# Patient Record
Sex: Female | Born: 1970 | Race: Black or African American | Hispanic: No | Marital: Married | Smoking: Never smoker
Health system: Southern US, Community
[De-identification: ages and names within clinical notes are randomized; demographics above are authoritative.]

## PROBLEM LIST (undated history)

## (undated) DIAGNOSIS — E559 Vitamin D deficiency, unspecified: Secondary | ICD-10-CM

## (undated) DIAGNOSIS — E079 Disorder of thyroid, unspecified: Secondary | ICD-10-CM

## (undated) DIAGNOSIS — E039 Hypothyroidism, unspecified: Secondary | ICD-10-CM

## (undated) DIAGNOSIS — I10 Essential (primary) hypertension: Secondary | ICD-10-CM

## (undated) HISTORY — PX: WISDOM TOOTH EXTRACTION: SHX21

## (undated) HISTORY — PX: UMBILICAL HERNIA REPAIR: SHX196

---

## 2013-08-18 ENCOUNTER — Encounter (HOSPITAL_BASED_OUTPATIENT_CLINIC_OR_DEPARTMENT_OTHER): Payer: Self-pay | Admitting: Emergency Medicine

## 2013-08-18 ENCOUNTER — Emergency Department (HOSPITAL_BASED_OUTPATIENT_CLINIC_OR_DEPARTMENT_OTHER)
Admission: EM | Admit: 2013-08-18 | Discharge: 2013-08-18 | Disposition: A | Discharge status: Home / Self Care | Payer: Managed Care, Other (non HMO) | Attending: Emergency Medicine | Admitting: Emergency Medicine

## 2013-08-18 DIAGNOSIS — N390 Urinary tract infection, site not specified: Secondary | ICD-10-CM | POA: Insufficient documentation

## 2013-08-18 DIAGNOSIS — N898 Other specified noninflammatory disorders of vagina: Secondary | ICD-10-CM | POA: Insufficient documentation

## 2013-08-18 DIAGNOSIS — Z3202 Encounter for pregnancy test, result negative: Secondary | ICD-10-CM | POA: Insufficient documentation

## 2013-08-18 DIAGNOSIS — Z79899 Other long term (current) drug therapy: Secondary | ICD-10-CM | POA: Insufficient documentation

## 2013-08-18 DIAGNOSIS — E079 Disorder of thyroid, unspecified: Secondary | ICD-10-CM | POA: Insufficient documentation

## 2013-08-18 DIAGNOSIS — I10 Essential (primary) hypertension: Secondary | ICD-10-CM | POA: Insufficient documentation

## 2013-08-18 HISTORY — DX: Disorder of thyroid, unspecified: E07.9

## 2013-08-18 HISTORY — DX: Essential (primary) hypertension: I10

## 2013-08-18 LAB — URINALYSIS, ROUTINE W REFLEX MICROSCOPIC
BILIRUBIN URINE: NEGATIVE
Glucose, UA: NEGATIVE mg/dL
KETONES UR: NEGATIVE mg/dL
NITRITE: NEGATIVE
PROTEIN: NEGATIVE mg/dL
SPECIFIC GRAVITY, URINE: 1.018 (ref 1.005–1.030)
UROBILINOGEN UA: 1 mg/dL (ref 0.0–1.0)
pH: 6 (ref 5.0–8.0)

## 2013-08-18 LAB — URINE MICROSCOPIC-ADD ON

## 2013-08-18 LAB — PREGNANCY, URINE: Preg Test, Ur: NEGATIVE

## 2013-08-18 MED ORDER — NITROFURANTOIN MONOHYD MACRO 100 MG PO CAPS: 100.0000 mg | ORAL_CAPSULE | Freq: Two times a day (BID) | ORAL | Status: DC

## 2013-08-18 NOTE — ED Notes (Signed)
Vaginal discharge and itching x 3 days.

## 2013-08-18 NOTE — ED Provider Notes (Signed)
CSN: [id number]     Arrival date & time 08/18/13  2235 History  This chart was scribed for Lorenso Quirino Alfonso Patten, MD by Eston Mould, ED Scribe. This patient was seen in room MH08/MH08 and the patient's care was started at 11:32 PM.   Chief Complaint  Patient presents with  . Vaginal Itching   Patient is a 43 y.o. female presenting with vaginal itching. The history is provided by the patient. No language interpreter was used.  Vaginal Itching This is a new problem. The current episode started more than 2 days ago. The problem occurs constantly. The problem has not changed since onset.Nothing aggravates the symptoms. Nothing relieves the symptoms. She has tried nothing for the symptoms.   HPI Comments: Melissa Cummings is a 43 y.o. female who presents to the Emergency Department complaining of ongoing constant vaginal discharge and itching for the past 3 days. Pt states she suspects she is having a vaginal irritation due to a new shower gel. Pt states she has been using OTC vagicil. Pt denies frequency and dysuria. No discharge  Past Medical History  Diagnosis Date  . Thyroid disease   . Hypertension    History reviewed. No pertinent past surgical history. No family history on file. History  Substance Use Topics  . Smoking status: Never Smoker   . Smokeless tobacco: Not on file  . Alcohol Use: No   OB History   Grav Para Term Preterm Abortions TAB SAB Ect Mult Living                 Review of Systems  Genitourinary: Negative for dysuria and frequency.  All other systems reviewed and are negative.    Allergies  Review of patient's allergies indicates no known allergies.  Home Medications   Current Outpatient Rx  Name  Route  Sig  Dispense  Refill  . HYDROCHLOROTHIAZIDE PO   Oral   Take by mouth.         . Levothyroxine Sodium (SYNTHROID PO)   Oral   Take by mouth.          BP 119/84  Pulse 89  Temp(Src) 98.9 F (37.2 C) (Oral)  Resp 18  SpO2  98%  Physical Exam  Constitutional: She is oriented to person, place, and time. She appears well-developed and well-nourished.  HENT:  Head: Normocephalic.  Mouth/Throat: Oropharynx is clear and moist.  No swelling of lips, tongue or uvula.  Eyes: Pupils are equal, round, and reactive to light.  Neck: Normal range of motion. Neck supple.  Cardiovascular: Normal rate, regular rhythm and normal heart sounds.  Exam reveals no gallop and no friction rub.   No murmur heard. Pulmonary/Chest: Breath sounds normal. She has no rales.  Abdominal: Soft. Bowel sounds are normal. She exhibits no distension.  Genitourinary:  No redness. No vesicles. Just dryness. On external exam chaperone present  Musculoskeletal: Normal range of motion.  Neurological: She is alert and oriented to person, place, and time.  Skin: Skin is warm and dry.  Psychiatric: She has a normal mood and affect.     ED Course  Procedures DIAGNOSTIC STUDIES: Oxygen Saturation is 98% on RA, normal by my interpretation.    COORDINATION OF CARE: 11:35 PM-Discussed treatment plan which includes advised pt to avoid using shower gel and will discharge pt with antibiotics. Pt agreed to plan.   Labs Review Labs Reviewed  URINALYSIS, ROUTINE W REFLEX MICROSCOPIC  PREGNANCY, URINE   Imaging Review No results found.  EKG Interpretation None     MDM   Final diagnoses:  None    Reaction to chemical, UTI will treat  I personally performed the services described in this documentation, which was scribed in my presence. The recorded information has been reviewed and is accurate.    Carlisle Beers, MD 08/19/13 [phone]

## 2014-02-13 ENCOUNTER — Other Ambulatory Visit: Payer: Self-pay | Admitting: Family Medicine

## 2014-02-13 DIAGNOSIS — M79661 Pain in right lower leg: Secondary | ICD-10-CM

## 2014-02-14 ENCOUNTER — Ambulatory Visit
Admission: RE | Admit: 2014-02-14 | Discharge: 2014-02-14 | Disposition: A | Discharge status: Home / Self Care | Payer: Self-pay | Source: Ambulatory Visit | Attending: Family Medicine | Admitting: Family Medicine

## 2014-02-14 ENCOUNTER — Ambulatory Visit
Admission: RE | Admit: 2014-02-14 | Discharge: 2014-02-14 | Disposition: A | Discharge status: Home / Self Care | Payer: Managed Care, Other (non HMO) | Source: Ambulatory Visit | Attending: Family Medicine | Admitting: Family Medicine

## 2014-02-14 ENCOUNTER — Other Ambulatory Visit: Payer: Self-pay | Admitting: Family Medicine

## 2014-02-14 DIAGNOSIS — M79604 Pain in right leg: Secondary | ICD-10-CM

## 2014-02-14 DIAGNOSIS — M79661 Pain in right lower leg: Secondary | ICD-10-CM

## 2014-02-19 ENCOUNTER — Other Ambulatory Visit: Payer: Self-pay

## 2014-12-27 IMAGING — US US EXTREM LOW VENOUS*R*
1 series · 14 of 24 positions shown · non-contrast
Comparison: None

CLINICAL DATA: RIGHT LEG PAIN/?DVT

EXAM:
Right LOWER EXTREMITY VENOUS DOPPLER ULTRASOUND
TECHNIQUE: Gray-scale sonography with compression, as well as color and duplex
ultrasound, were performed to evaluate the deep venous system from
the level of the common femoral vein through the popliteal and
proximal calf veins.

[Series 1: us extrem low venous*right* · 14 of 28 slices shown]
[im 1/28]
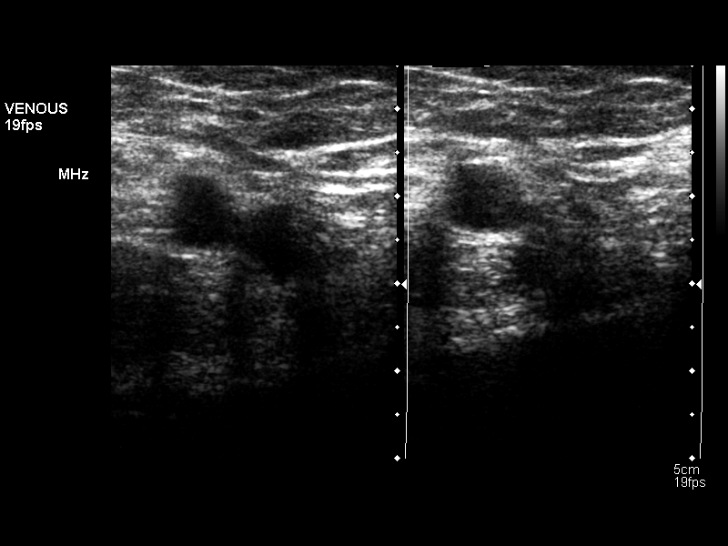
[im 3/28]
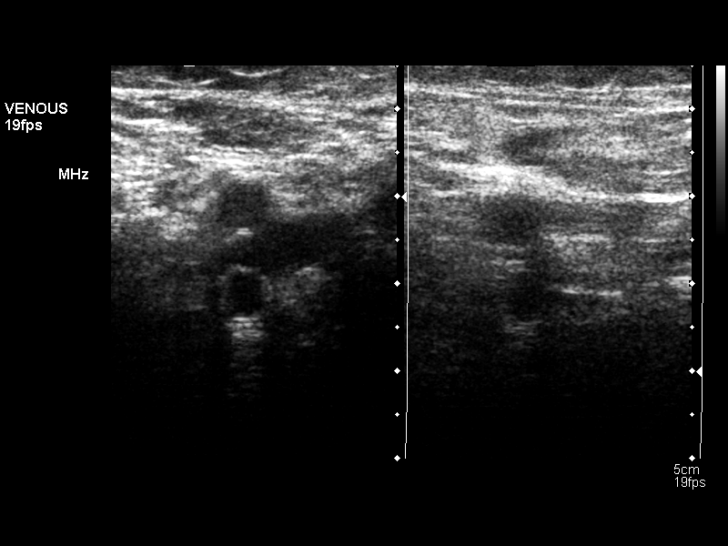
[im 5/28]
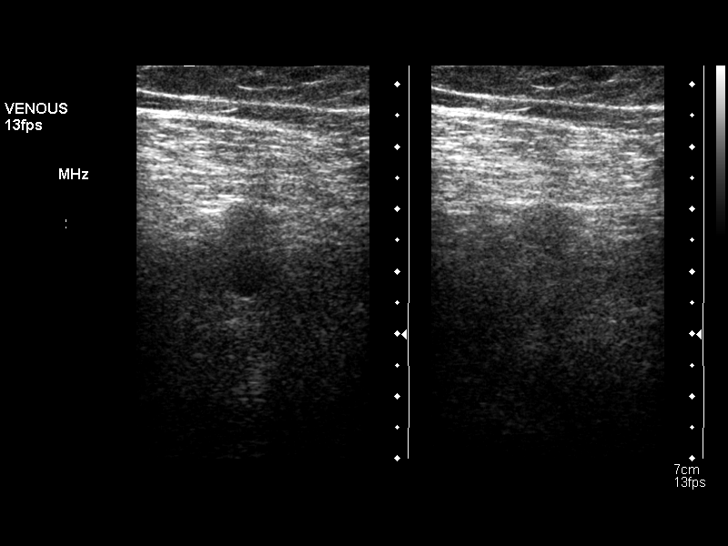
[im 8/28]
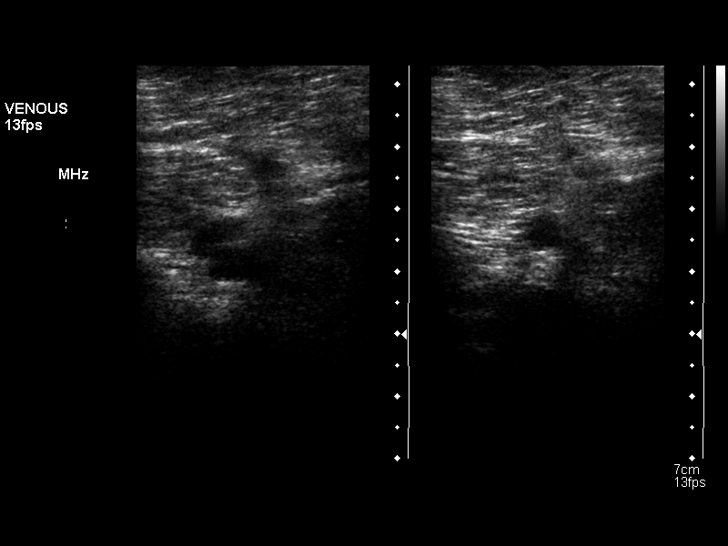
[im 9/28]
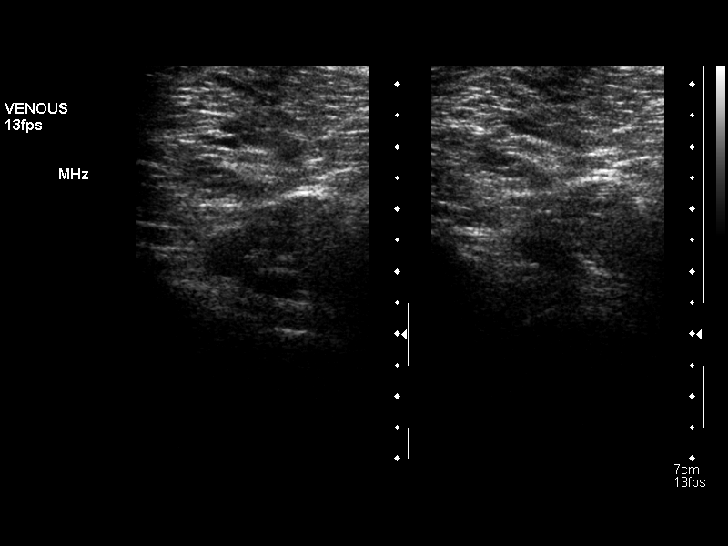
[im 11/28]
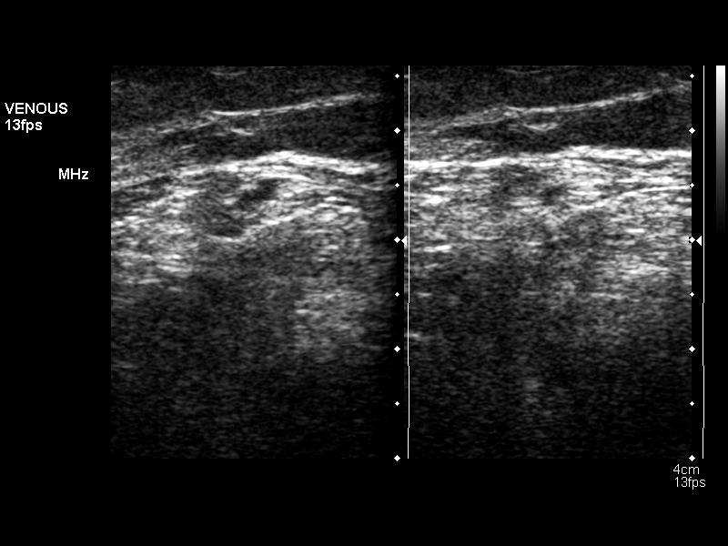
[im 13/28]
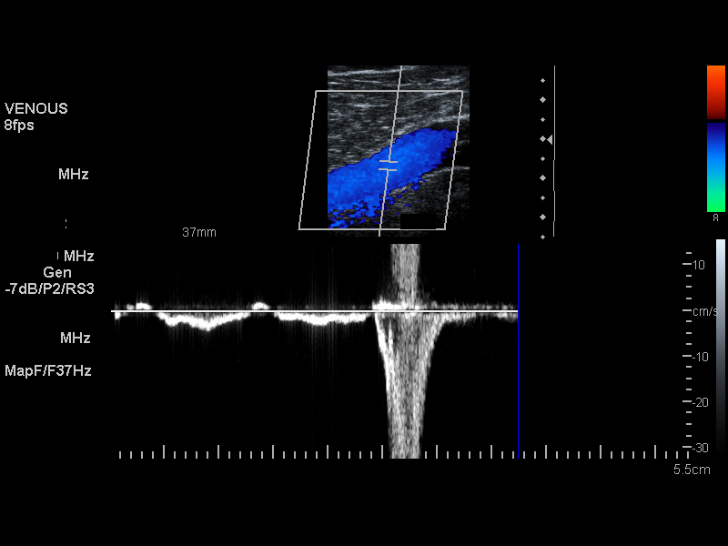
[im 15/28]
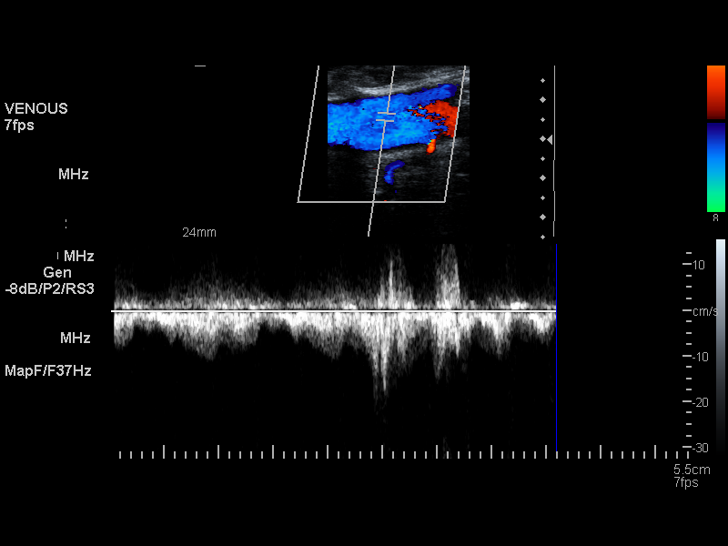
[im 17/28]
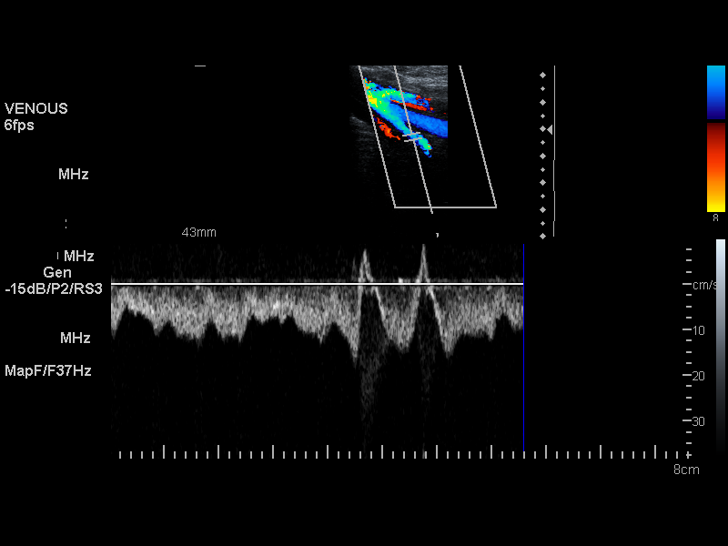
[im 19/28]
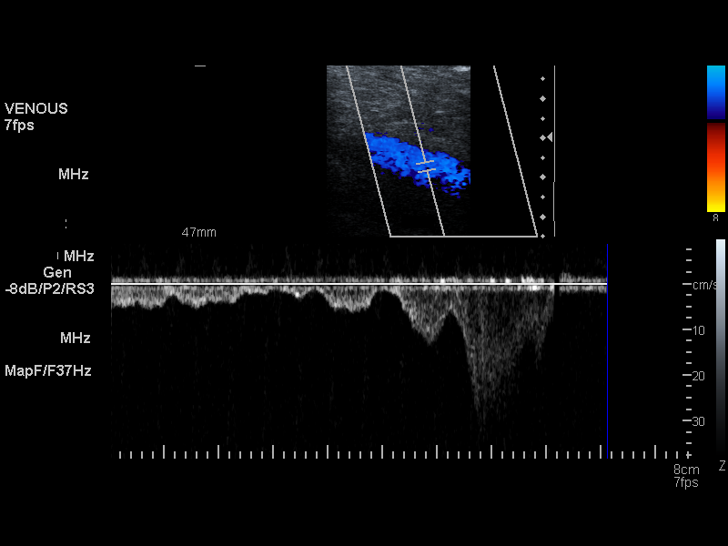
[im 22/28]
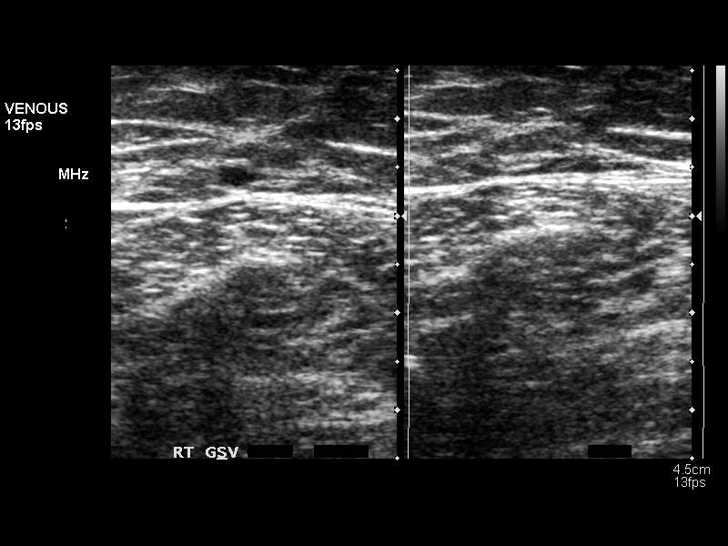
[im 23/28]
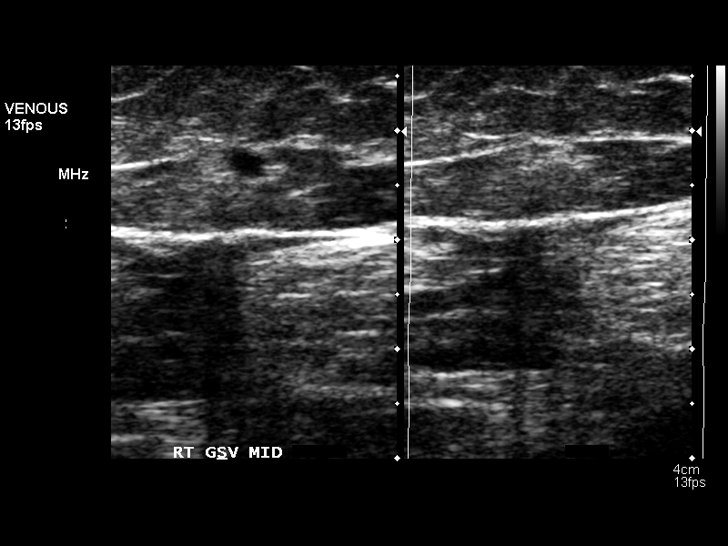
[im 25/28]
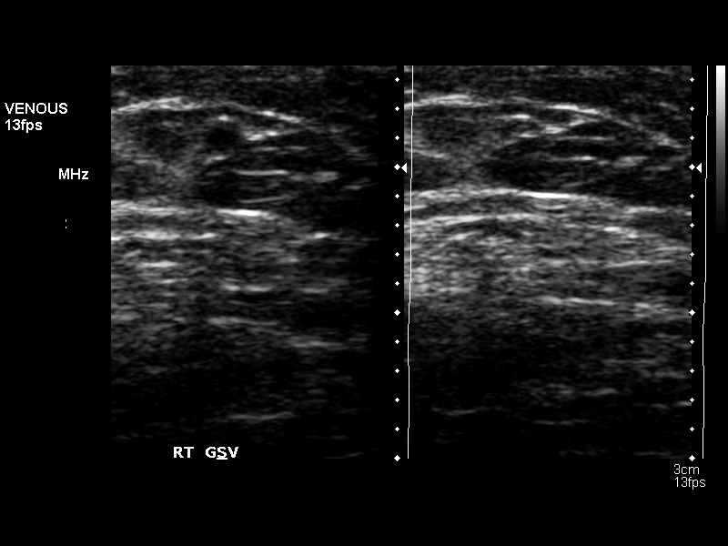
[im 28/28]
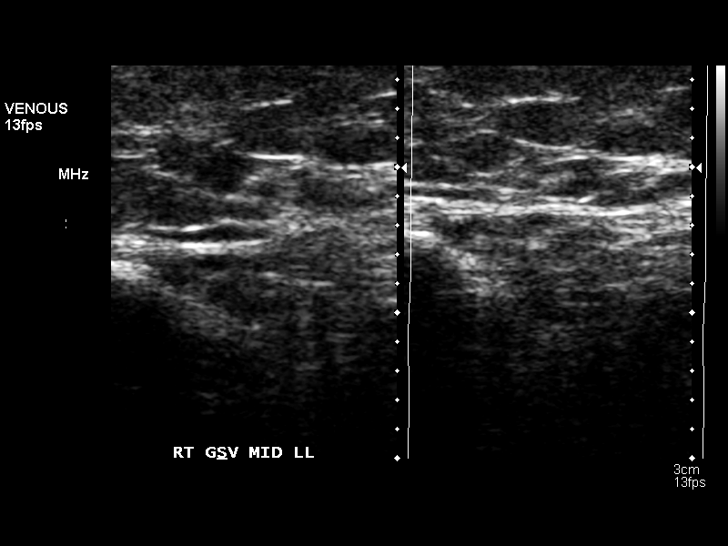

[14 of 24 positions shown; findings below may reference images not displayed]

FINDINGS: Normal compressibility of the common femoral, superficial femoral,
and popliteal veins, as well as the proximal calf veins. No filling
defects to suggest DVT on grayscale or color Doppler imaging.
Doppler waveforms show normal direction of venous flow, normal
respiratory phasicity and response to augmentation. Visualized
segments of the saphenous venous system normal in caliber and
compressibility.
IMPRESSION: 1.  No evidence of lower extremity deep vein thrombosis,right.

## 2016-07-23 ENCOUNTER — Emergency Department (HOSPITAL_COMMUNITY)
Admission: EM | Admit: 2016-07-23 | Discharge: 2016-07-23 | Discharge status: Absent without leave | Payer: Managed Care, Other (non HMO)

## 2017-01-14 ENCOUNTER — Other Ambulatory Visit: Payer: Self-pay | Admitting: Obstetrics & Gynecology

## 2017-01-15 NOTE — Patient Instructions (Addendum)
Your procedure is scheduled on:  Friday, August 3  Enter through the [employer] [facility] at:  8 am  Pick up the phone at the desk and dial [phone].  Call this number if you have problems the morning of surgery: [phone].  Remember: Do NOT eat food or drink clear liquids (including water) after Midnight Thursday  Take these medicines the morning of surgery with a SIP OF WATER:  synthroid  Stop herbal medications and supplements at this time.  Do NOT wear jewelry (body piercing), metal hair clips/bobby pins, make-up, or nail polish. Do NOT wear lotions, powders, or perfumes.  You may wear deoderant. Do NOT shave for 48 hours prior to surgery. Do NOT bring valuables to the hospital. Contacts may not be worn into surgery.  Have a responsible adult drive you home and stay with you for 24 hours after your procedure.  Home with husband Aaron Edelman cell [phone].

## 2017-01-18 ENCOUNTER — Encounter (HOSPITAL_COMMUNITY): Payer: Self-pay

## 2017-01-18 ENCOUNTER — Encounter (HOSPITAL_COMMUNITY)
Admission: RE | Admit: 2017-01-18 | Discharge: 2017-01-18 | Disposition: A | Discharge status: Home / Self Care | Payer: Managed Care, Other (non HMO) | Source: Ambulatory Visit | Attending: Obstetrics & Gynecology | Admitting: Obstetrics & Gynecology

## 2017-01-18 ENCOUNTER — Other Ambulatory Visit: Payer: Self-pay

## 2017-01-18 DIAGNOSIS — Z01810 Encounter for preprocedural cardiovascular examination: Secondary | ICD-10-CM | POA: Insufficient documentation

## 2017-01-18 DIAGNOSIS — I498 Other specified cardiac arrhythmias: Secondary | ICD-10-CM | POA: Diagnosis not present

## 2017-01-18 DIAGNOSIS — Z01812 Encounter for preprocedural laboratory examination: Secondary | ICD-10-CM | POA: Diagnosis present

## 2017-01-18 HISTORY — DX: Hypothyroidism, unspecified: E03.9

## 2017-01-18 HISTORY — DX: Vitamin D deficiency, unspecified: E55.9

## 2017-01-18 LAB — CBC
HCT: 34.7 % — ABNORMAL LOW (ref 36.0–46.0)
Hemoglobin: 11.7 g/dL — ABNORMAL LOW (ref 12.0–15.0)
MCH: 27.5 pg (ref 26.0–34.0)
MCHC: 33.7 g/dL (ref 30.0–36.0)
MCV: 81.6 fL (ref 78.0–100.0)
PLATELETS: 336 10*3/uL (ref 150–400)
RBC: 4.25 MIL/uL (ref 3.87–5.11)
RDW: 12.7 % (ref 11.5–15.5)
WBC: 7.5 10*3/uL (ref 4.0–10.5)

## 2017-01-18 LAB — BASIC METABOLIC PANEL
Anion gap: 9 (ref 5–15)
BUN: 13 mg/dL (ref 6–20)
CALCIUM: 8.8 mg/dL — AB (ref 8.9–10.3)
CO2: 25 mmol/L (ref 22–32)
CREATININE: 0.67 mg/dL (ref 0.44–1.00)
Chloride: 101 mmol/L (ref 101–111)
GFR calc Af Amer: >60 mL/min (ref >60)
GLUCOSE: 99 mg/dL (ref 65–99)
Potassium: 3.7 mmol/L (ref 3.5–5.1)
Sodium: 135 mmol/L (ref 135–145)

## 2017-01-22 ENCOUNTER — Ambulatory Visit (HOSPITAL_COMMUNITY): Payer: Managed Care, Other (non HMO) | Admitting: Anesthesiology

## 2017-01-22 ENCOUNTER — Encounter (HOSPITAL_COMMUNITY)
Admission: RE | Disposition: A | Discharge status: Home / Self Care | Payer: Self-pay | Source: Ambulatory Visit | Attending: Obstetrics & Gynecology

## 2017-01-22 ENCOUNTER — Ambulatory Visit (HOSPITAL_COMMUNITY)
Admission: RE | Admit: 2017-01-22 | Discharge: 2017-01-22 | Disposition: A | Discharge status: Home / Self Care | Payer: Managed Care, Other (non HMO) | Source: Ambulatory Visit | Attending: Obstetrics & Gynecology | Admitting: Obstetrics & Gynecology

## 2017-01-22 ENCOUNTER — Encounter (HOSPITAL_COMMUNITY): Payer: Self-pay | Admitting: Anesthesiology

## 2017-01-22 DIAGNOSIS — N921 Excessive and frequent menstruation with irregular cycle: Secondary | ICD-10-CM | POA: Insufficient documentation

## 2017-01-22 DIAGNOSIS — E039 Hypothyroidism, unspecified: Secondary | ICD-10-CM | POA: Diagnosis not present

## 2017-01-22 DIAGNOSIS — I10 Essential (primary) hypertension: Secondary | ICD-10-CM | POA: Insufficient documentation

## 2017-01-22 HISTORY — PX: DILITATION & CURRETTAGE/HYSTROSCOPY WITH HYDROTHERMAL ABLATION: SHX5570

## 2017-01-22 LAB — PREGNANCY, URINE: Preg Test, Ur: NEGATIVE

## 2017-01-22 SURGERY — DILATATION & CURETTAGE/HYSTEROSCOPY WITH HYDROTHERMAL ABLATION
Anesthesia: General | Site: Vagina

## 2017-01-22 MED ORDER — ACETAMINOPHEN 160 MG/5ML PO SOLN
ORAL | Status: AC
  Administered 2017-01-22: 975 mg via ORAL
  Filled 2017-01-22: qty 40.6

## 2017-01-22 MED ORDER — LIDOCAINE HCL (CARDIAC) 20 MG/ML IV SOLN
INTRAVENOUS | Status: AC
  Filled 2017-01-22: qty 5

## 2017-01-22 MED ORDER — CHLOROPROCAINE HCL 1 % IJ SOLN
INTRAMUSCULAR | Status: AC
  Filled 2017-01-22: qty 30

## 2017-01-22 MED ORDER — DEXAMETHASONE SODIUM PHOSPHATE 4 MG/ML IJ SOLN
INTRAMUSCULAR | Status: AC
  Filled 2017-01-22: qty 1

## 2017-01-22 MED ORDER — PROPOFOL 10 MG/ML IV BOLUS
INTRAVENOUS | Status: DC | PRN
  Administered 2017-01-22: 200 mg via INTRAVENOUS

## 2017-01-22 MED ORDER — CARBOPROST TROMETHAMINE 250 MCG/ML IM SOLN
INTRAMUSCULAR | Status: DC | PRN
  Administered 2017-01-22: 250 ug via INTRAMUSCULAR

## 2017-01-22 MED ORDER — ACETAMINOPHEN 160 MG/5ML PO SOLN
975.0000 mg | Freq: Four times a day (QID) | ORAL | Status: AC | PRN
  Administered 2017-01-22: 975 mg via ORAL

## 2017-01-22 MED ORDER — FENTANYL CITRATE (PF) 100 MCG/2ML IJ SOLN
INTRAMUSCULAR | Status: AC
  Filled 2017-01-22: qty 2

## 2017-01-22 MED ORDER — OXYCODONE-ACETAMINOPHEN 5-325 MG PO TABS: 1.0000 | ORAL_TABLET | Freq: Four times a day (QID) | ORAL | 0 refills | Status: DC | PRN

## 2017-01-22 MED ORDER — IBUPROFEN 200 MG PO TABS: 600.0000 mg | ORAL_TABLET | Freq: Four times a day (QID) | ORAL | 0 refills | Status: AC | PRN

## 2017-01-22 MED ORDER — ONDANSETRON HCL 4 MG/2ML IJ SOLN
INTRAMUSCULAR | Status: AC
  Filled 2017-01-22: qty 2

## 2017-01-22 MED ORDER — LIDOCAINE-EPINEPHRINE 1 %-1:100000 IJ SOLN
INTRAMUSCULAR | Status: AC
Start: 2017-01-22 — End: ?
  Filled 2017-01-22: qty 1

## 2017-01-22 MED ORDER — SCOPOLAMINE 1 MG/3DAYS TD PT72
1.0000 | MEDICATED_PATCH | Freq: Once | TRANSDERMAL | Status: DC
  Administered 2017-01-22: 1.5 mg via TRANSDERMAL

## 2017-01-22 MED ORDER — FENTANYL CITRATE (PF) 250 MCG/5ML IJ SOLN
INTRAMUSCULAR | Status: DC | PRN
  Administered 2017-01-22: 50 ug via INTRAVENOUS
  Administered 2017-01-22 (×2): 25 ug via INTRAVENOUS
  Administered 2017-01-22 (×2): 50 ug via INTRAVENOUS

## 2017-01-22 MED ORDER — FENTANYL CITRATE (PF) 100 MCG/2ML IJ SOLN
INTRAMUSCULAR | Status: AC
  Administered 2017-01-22: 25 ug via INTRAVENOUS
  Filled 2017-01-22: qty 2

## 2017-01-22 MED ORDER — LIDOCAINE HCL (CARDIAC) 20 MG/ML IV SOLN
INTRAVENOUS | Status: DC | PRN
  Administered 2017-01-22: 80 mg via INTRAVENOUS

## 2017-01-22 MED ORDER — MIDAZOLAM HCL 2 MG/2ML IJ SOLN
INTRAMUSCULAR | Status: AC
  Filled 2017-01-22: qty 2

## 2017-01-22 MED ORDER — KETOROLAC TROMETHAMINE 30 MG/ML IJ SOLN
INTRAMUSCULAR | Status: DC | PRN
  Administered 2017-01-22: 30 mg via INTRAVENOUS

## 2017-01-22 MED ORDER — ONDANSETRON HCL 4 MG/2ML IJ SOLN
INTRAMUSCULAR | Status: DC | PRN
  Administered 2017-01-22: 4 mg via INTRAVENOUS

## 2017-01-22 MED ORDER — SODIUM CHLORIDE 0.9 % IR SOLN
Status: DC | PRN
  Administered 2017-01-22: 3000 mL

## 2017-01-22 MED ORDER — MIDAZOLAM HCL 2 MG/2ML IJ SOLN
INTRAMUSCULAR | Status: DC | PRN
  Administered 2017-01-22: 2 mg via INTRAVENOUS

## 2017-01-22 MED ORDER — FENTANYL CITRATE (PF) 100 MCG/2ML IJ SOLN
25.0000 ug | INTRAMUSCULAR | Status: DC | PRN
  Administered 2017-01-22 (×4): 25 ug via INTRAVENOUS

## 2017-01-22 MED ORDER — SCOPOLAMINE 1 MG/3DAYS TD PT72
MEDICATED_PATCH | TRANSDERMAL | Status: AC
  Administered 2017-01-22: 1.5 mg via TRANSDERMAL
  Filled 2017-01-22: qty 1

## 2017-01-22 MED ORDER — LACTATED RINGERS IV SOLN
INTRAVENOUS | Status: DC
  Administered 2017-01-22: 11:00:00 via INTRAVENOUS
  Administered 2017-01-22: 125 mL/h via INTRAVENOUS

## 2017-01-22 MED ORDER — GENTAMICIN SULFATE 40 MG/ML IJ SOLN
INTRAVENOUS | Status: AC
  Administered 2017-01-22: 114.75 mL via INTRAVENOUS
  Filled 2017-01-22: qty 8.75

## 2017-01-22 MED ORDER — DEXAMETHASONE SODIUM PHOSPHATE 4 MG/ML IJ SOLN
INTRAMUSCULAR | Status: DC | PRN
  Administered 2017-01-22: 10 mg via INTRAVENOUS

## 2017-01-22 MED ORDER — PROPOFOL 10 MG/ML IV BOLUS
INTRAVENOUS | Status: AC
  Filled 2017-01-22: qty 40

## 2017-01-22 MED ORDER — CHLOROPROCAINE HCL 1 % IJ SOLN
INTRAMUSCULAR | Status: DC | PRN
  Administered 2017-01-22: 10 mL

## 2017-01-22 SURGICAL SUPPLY — 19 items
ABLATOR ENDOMETRIAL BIPOLAR (ABLATOR) ×4 IMPLANT
BIPOLAR CUTTING LOOP 21FR (ELECTRODE)
CANISTER SUCT 3000ML PPV (MISCELLANEOUS) ×4 IMPLANT
CATH ROBINSON RED A/P 16FR (CATHETERS) ×4 IMPLANT
CLOTH BEACON ORANGE TIMEOUT ST (SAFETY) ×4 IMPLANT
CONTAINER PREFILL 10% NBF 60ML (FORM) ×8 IMPLANT
ELECT REM PT RETURN 9FT ADLT (ELECTROSURGICAL)
ELECTRODE REM PT RTRN 9FT ADLT (ELECTROSURGICAL) IMPLANT
GLOVE BIO SURGEON STRL SZ7 (GLOVE) ×4 IMPLANT
GLOVE BIOGEL PI IND STRL 7.0 (GLOVE) ×4 IMPLANT
GLOVE BIOGEL PI INDICATOR 7.0 (GLOVE) ×4
GOWN STRL REUS W/TWL LRG LVL3 (GOWN DISPOSABLE) ×8 IMPLANT
LOOP CUTTING BIPOLAR 21FR (ELECTRODE) IMPLANT
PACK VAGINAL MINOR WOMEN LF (CUSTOM PROCEDURE TRAY) ×4 IMPLANT
PAD OB MATERNITY 4.3X12.25 (PERSONAL CARE ITEMS) ×4 IMPLANT
SET GENESYS HTA PROCERVA (MISCELLANEOUS) IMPLANT
TOWEL OR 17X24 6PK STRL BLUE (TOWEL DISPOSABLE) ×8 IMPLANT
TUBING AQUILEX INFLOW (TUBING) ×4 IMPLANT
TUBING AQUILEX OUTFLOW (TUBING) ×4 IMPLANT

## 2017-01-22 NOTE — Op Note (Signed)
Preoperative diagnosis: Menometrorrhagia Postop diagnosis: as above.  Procedure: Hysteroscopic endometrial ablation with HTA system and D&C Anesthesia General via LMA  Surgeon: Azucena Fallen, MD  IV fluids: 1 liter Estimated blood loss : 200 cc Urine output: straight catheter preop   Complications none  Condition stable  Disposition PACU  Specimen: None   Procedure :  Indication: Menometrorhagia. 46 yo female with menorrhagia, slightly enlarged uterus, failed Mirena and OCs, here for endometrial ablation, office endometrial biopsy is negative.   Informed written consent obtained and risks/ complications reviewed, patient voiced understanding. She was brought to the Operating Room with IV running. Time out carried out, all agreed. General anethesia via LMA followed by dorsal lithotomy and prepped and draped, bladder catheterized once with straight catheter.  Speculum placed, cervix grasped with tenaculum and paracervical block given with 20 cc of 1% Nesacaine. Os was already noted to be dilated. Uterus sounded to 12 cm, hence Novasure was not an option, so HTA was requested.  After initial problem with getting HTA device to start, using a second assembly, it initiated the system. Then hysteroscopy was begun. Initial seal test failed, so a second tenaculum was placed which helped to close up the cervix better and pass HTA seal test.  HTA was performed in 8 minutes followed by cooling time. After system reported cooling time to be complete, hysteroscopy was performed, image was not very clear due to bleeding. Hysteroscope removed and gentle curettage performed to removed clots and reduce bleeding. Active bleeding still noted from cervical os and hence one injection 250mcg Hemabate given IM which helped. Hemostasis improved. All instruments removed and patient reversed from anesthesia and brought to PACU in stable condition. All counts correct x 2.  Findings reviewed with patient and her husband.  Post-op care reviewed, F/up 2 wks.   I performed the surgery. Manon Hilding, MD  Hysteroscopic endometrial thermal ablation done with HTA system in stepwise fashion.  Post-op brink bleeding noted from the os, patient given Hemabate 250 mcg x 1 dose IM. Bleeding improved.   All counts are correct x2. No complications. Brought to PACU in stable condition.  Patient will be discharged home today. F/up in 2 wks. Findings reviewed with husband.   V.Ketrick Matney, MD.

## 2017-01-22 NOTE — Anesthesia Postprocedure Evaluation (Addendum)
Anesthesia Post Note  Patient: Giannina Clark-Hall  Procedure(s) Performed: Procedure(s) (LRB): DILATATION & CURETTAGE/HYSTEROSCOPY WITH HYDROTHERMAL ABLATION (N/A)     Patient location during evaluation: PACU Anesthesia Type: General Level of consciousness: awake and alert Pain management: pain level controlled Vital Signs Assessment: post-procedure vital signs reviewed and stable Respiratory status: spontaneous breathing, nonlabored ventilation, respiratory function stable and patient connected to nasal cannula oxygen Cardiovascular status: blood pressure returned to baseline and stable Postop Assessment: no signs of nausea or vomiting Anesthetic complications: no    Last Vitals:  Vitals:   01/22/17 1200 01/22/17 1300  BP:    Pulse:    Resp:    Temp: 37.1 C 36.9 C    Last Pain:  Vitals:   01/22/17 1315  TempSrc:   PainSc: 3    Pain Goal: Patients Stated Pain Goal: 4 (01/22/17 1315)               Lyndle Herrlich EDWARD

## 2017-01-22 NOTE — H&P (Signed)
Melissa Cummings is an 46 y.o. female with menorrhagia. Here for endometrial ablation with either Novasure or HTA depending on cavity size. Patient has slightly enlarged uterus with fibroids  She has failed Mirena and low dose OCs to control her menorrhagia. Desires to avoid hysterectomy.  Nl paps, Nl mammograms.  Office endometrial biopsy- normal, no endometrial hyperplasia or cancer. No famHx of Uterine/ ovarian/ colon cancer.   Patient's last menstrual period was 01/14/2017 (exact date).    Past Medical History:  Diagnosis Date  . Hypertension   . Hypothyroidism   . SVD (spontaneous vaginal delivery)    x 3  . Thyroid disease   . Vitamin D deficiency     Past Surgical History:  Procedure Laterality Date  . UMBILICAL HERNIA REPAIR     age 16 yrs  . WISDOM TOOTH EXTRACTION      No family history on file.  Social History:  reports that she has never smoked. She has never used smokeless tobacco. She reports that she drinks alcohol. She reports that she does not use drugs.  Allergies: No Known Allergies  No prescriptions prior to admission.    ROS fatigue, pelvic cramps   Last menstrual period 01/14/2017. Physical Exam A&O x 3, no acute distress. Pleasant HEENT neg, no thyromegaly Lungs CTA bilat CV RRR, S1S2 normal Abdo soft, non tender, non acute Extr no edema/ tenderness Pelvic  Uterus 10 wk, no adnexal mass   No results found for this or any previous visit (from the past 24 hour(s)).  No results found.  Assessment/Plan: 46 yo female with menorrhagia and not desiring future childbearing, is here for endometrial ablation due to failed medical therapy.  Plan Novasure but may need HTA after assessing cavity if sounds >10 cmm will proceed with HTA ablation. Hence planning ablation in OR and not in office for possible HTA option at the same time.  Pt was counseled as such and agrees.  Risks/complications of surgery reviewed incl infection, bleeding, damage to  internal organs including bladder, bowels, ureters, blood vessels, other risks from anesthesia, VTE and delayed complications of any surgery, complications in future surgery reviewed. Reviewed success at 85% with some reduction in menses or amenorrhea and small failure rate. Considering age, possible onset of menopause in few years if ablation works, will avoid hysterectomy.   Phelicia Dantes R 01/22/2017, 6:34 AM

## 2017-01-22 NOTE — Anesthesia Preprocedure Evaluation (Addendum)
Anesthesia Evaluation  Patient identified by MRN, date of birth, ID band Patient awake    Reviewed: Allergy & Precautions, H&P , Patient's Chart, lab work & pertinent test results, reviewed documented beta blocker date and time   Airway Mallampati: II  TM Distance: >3 FB Neck ROM: full    Dental no notable dental hx.    Pulmonary    Pulmonary exam normal breath sounds clear to auscultation       Cardiovascular hypertension,  Rhythm:regular Rate:Normal     Neuro/Psych    GI/Hepatic   Endo/Other    Renal/GU      Musculoskeletal   Abdominal   Peds  Hematology   Anesthesia Other Findings   Reproductive/Obstetrics                             Anesthesia Physical Anesthesia Plan  ASA: II  Anesthesia Plan: General   Post-op Pain Management:    Induction: Intravenous  PONV Risk Score and Plan: 2 and Ondansetron, Dexamethasone and Scopolamine patch - Pre-op  Airway Management Planned: LMA  Additional Equipment:   Intra-op Plan:   Post-operative Plan:   Informed Consent: I have reviewed the patients History and Physical, chart, labs and discussed the procedure including the risks, benefits and alternatives for the proposed anesthesia with the patient or authorized representative who has indicated his/her understanding and acceptance.   Dental Advisory Given  Plan Discussed with: CRNA and Surgeon  Anesthesia Plan Comments: ( )       Anesthesia Quick Evaluation

## 2017-01-22 NOTE — Transfer of Care (Signed)
Immediate Anesthesia Transfer of Care Note  Patient: Melissa Cummings  Procedure(s) Performed: Procedure(s): DILATATION & CURETTAGE/HYSTEROSCOPY WITH HYDROTHERMAL ABLATION (N/A)  Patient Location: PACU  Anesthesia Type:General  Level of Consciousness: awake, alert  and oriented  Airway & Oxygen Therapy: Patient Spontanous Breathing and Patient connected to nasal cannula oxygen  Post-op Assessment: Report given to RN and Post -op Vital signs reviewed and stable  Post vital signs: Reviewed and stable  Last Vitals:  Vitals:   01/22/17 0825  BP: (!) 171/94  Pulse: 93  Resp: 16  Temp: 36.6 C    Last Pain:  Vitals:   01/22/17 0825  TempSrc: Oral      Patients Stated Pain Goal: 4 (68/03/21 2248)  Complications: No apparent anesthesia complications

## 2017-01-22 NOTE — Anesthesia Procedure Notes (Signed)
Procedure Name: LMA Insertion Date/Time: 01/22/2017 9:54 AM Performed by: Flossie Dibble Pre-anesthesia Checklist: Patient identified, Emergency Drugs available, Suction available, Patient being monitored and Timeout performed Patient Re-evaluated:Patient Re-evaluated prior to induction Oxygen Delivery Method: Circle system utilized Preoxygenation: Pre-oxygenation with 100% oxygen Induction Type: IV induction LMA Size: 4.0 Number of attempts: 1 Placement Confirmation: positive ETCO2 and breath sounds checked- equal and bilateral Tube secured with: Tape Dental Injury: Teeth and Oropharynx as per pre-operative assessment

## 2017-01-22 NOTE — Discharge Instructions (Addendum)
Post Anesthesia Home Care Instructions  Activity: Get plenty of rest for the remainder of the day. A responsible individual must stay with you for 24 hours following the procedure.  For the next 24 hours, DO NOT: -Drive a car -Paediatric nurse -Drink alcoholic beverages -Take any medication unless instructed by your physician -Make any legal decisions or sign important papers.  Meals: Start with liquid foods such as gelatin or soup. Progress to regular foods as tolerated. Avoid greasy, spicy, heavy foods. If nausea and/or vomiting occur, drink only clear liquids until the nausea and/or vomiting subsides. Call your physician if vomiting continues.  Special Instructions/Symptoms: Your throat may feel dry or sore from the anesthesia or the breathing tube placed in your throat during surgery. If this causes discomfort, gargle with warm salt water. The discomfort should disappear within 24 hours.  If you had a scopolamine patch placed behind your ear for the management of post- operative nausea and/or vomiting:  1. The medication in the patch is effective for 72 hours, after which it should be removed.  Wrap patch in a tissue and discard in the trash. Wash hands thoroughly with soap and water. 2. You may remove the patch earlier than 72 hours if you experience unpleasant side effects which may include dry mouth, dizziness or visual disturbances. 3. Avoid touching the patch. Wash your hands with soap and water after contact with the patch.   DISCHARGE INSTRUCTIONS: D&C / D&E The following instructions have been prepared to help you care for yourself upon your return home.   Personal hygiene:  Use sanitary pads for vaginal drainage, not tampons.  Shower the day after your procedure.  NO tub baths, pools or Jacuzzis for 2-3 weeks.  Wipe front to back after using the bathroom.  Activity and limitations:  Do NOT drive or operate any equipment for 24 hours. The effects of anesthesia are  still present and drowsiness may result.  Do NOT rest in bed all day.  Walking is encouraged.  Walk up and down stairs slowly.  You may resume your normal activity in one to two days or as indicated by your physician.  Sexual activity: NO intercourse for at least 2 weeks after the procedure, or as indicated by your physician.  Diet: Eat a light meal as desired this evening. You may resume your usual diet tomorrow.  Return to work: You may resume your work activities in one to two days or as indicated by your doctor.  What to expect after your surgery: Expect to have vaginal bleeding/discharge for 2-3 days and spotting for up to 10 days. It is not unusual to have soreness for up to 1-2 weeks. You may have a slight burning sensation when you urinate for the first day. Mild cramps may continue for a couple of days. You may have a regular period in 2-6 weeks.  Call your doctor for any of the following:  Excessive vaginal bleeding, saturating and changing one pad every hour.  Inability to urinate 6 hours after discharge from hospital.  Pain not relieved by pain medication.  Fever of 100.4 F or greater.  Unusual vaginal discharge or odor.   Call for an appointment:    Patients signature: ______________________  Nurses signature ________________________  Support person's signature_______________________   Endometrial Ablation Endometrial ablation is a procedure that destroys the thin inner layer of the lining of the uterus (endometrium). This procedure may be done:  To stop heavy periods.  To stop bleeding that is causing  anemia.  To control irregular bleeding.  To treat bleeding caused by small tumors (fibroids) in the endometrium.  This procedure is often an alternative to major surgery, such as removal of the uterus and cervix (hysterectomy). As a result of this procedure:  You may not be able to have children. However, if you are premenopausal (you have not gone  through menopause): ? You may still have a small chance of getting pregnant. ? You will need to use a reliable method of birth control after the procedure to prevent pregnancy.  You may stop having a menstrual period, or you may have only a small amount of bleeding during your period. Menstruation may return several years after the procedure.  Tell a health care provider about:  Any allergies you have.  All medicines you are taking, including vitamins, herbs, eye drops, creams, and over-the-counter medicines.  Any problems you or family members have had with the use of anesthetic medicines.  Any blood disorders you have.  Any surgeries you have had.  Any medical conditions you have. What are the risks? Generally, this is a safe procedure. However, problems may occur, including:  A hole (perforation) in the uterus or bowel.  Infection of the uterus, bladder, or vagina.  Bleeding.  Damage to other structures or organs.  An air bubble in the lung (air embolus).  Problems with pregnancy after the procedure.  Failure of the procedure.  Decreased ability to diagnose cancer in the endometrium.  What happens before the procedure?  You will have tests of your endometrium to make sure there are no pre-cancerous cells or cancer cells present.  You may have an ultrasound of the uterus.  You may be given medicines to thin the endometrium.  Ask your health care provider about: ? Changing or stopping your regular medicines. This is especially important if you take diabetes medicines or blood thinners. ? Taking medicines such as aspirin and ibuprofen. These medicines can thin your blood. Do not take these medicines before your procedure if your doctor tells you not to.  Plan to have someone take you home from the hospital or clinic. What happens during the procedure?  You will lie on an exam table with your feet and legs supported as in a pelvic exam.  To lower your risk of  infection: ? Your health care team will wash or sanitize their hands and put on germ-free (sterile) gloves. ? Your genital area will be washed with soap.  An IV tube will be inserted into one of your veins.  You will be given a medicine to help you relax (sedative).  A surgical instrument with a light and camera (resectoscope) will be inserted into your vagina and moved into your uterus. This allows your surgeon to see inside your uterus.  Endometrial tissue will be removed using one of the following methods: ? Radiofrequency. This method uses a radiofrequency-alternating electric current to remove the endometrium. ? Cryotherapy. This method uses extreme cold to freeze the endometrium. ? Heated-free liquid. This method uses a heated saltwater (saline) solution to remove the endometrium. ? Microwave. This method uses high-energy microwaves to heat up the endometrium and remove it. ? Thermal balloon. This method involves inserting a catheter with a balloon tip into the uterus. The balloon tip is filled with heated fluid to remove the endometrium. The procedure may vary among health care providers and hospitals. What happens after the procedure?  Your blood pressure, heart rate, breathing rate, and blood oxygen level will  be monitored until the medicines you were given have worn off.  As tissue healing occurs, you may notice vaginal bleeding for 4-6 weeks after the procedure. You may also experience: ? Cramps. ? Thin, watery vaginal discharge that is light pink or brown in color. ? A need to urinate more frequently than usual. ? Nausea.  Do not drive for 24 hours if you were given a sedative.  Do not have sex or insert anything into your vagina until your health care provider approves. Summary  Endometrial ablation is done to treat the many causes of heavy menstrual bleeding.  The procedure may be done only after medications have been tried to control the bleeding.  Plan to have  someone take you home from the hospital or clinic. This information is not intended to replace advice given to you by your health care provider. Make sure you discuss any questions you have with your health care provider. Document Released: 04/17/2004 Document Revised: 06/25/2016 Document Reviewed: 06/25/2016 Elsevier Interactive Patient Education  2017 [employer].

## 2017-01-23 ENCOUNTER — Encounter (HOSPITAL_COMMUNITY): Payer: Self-pay | Admitting: Obstetrics & Gynecology

## 2019-09-01 ENCOUNTER — Ambulatory Visit: Payer: Managed Care, Other (non HMO) | Attending: Internal Medicine

## 2019-09-01 DIAGNOSIS — Z23 Encounter for immunization: Secondary | ICD-10-CM

## 2019-09-01 NOTE — Progress Notes (Signed)
   Covid-19 Vaccination Clinic  Name:  Melissa Cummings    MRN: [id number] DOB: 05/09/1971  09/01/2019  Melissa Cummings was observed post Covid-19 immunization for 15 minutes without incident. She was provided with Vaccine Information Sheet and instruction to access the V-Safe system.   Melissa Cummings was instructed to call 911 with any severe reactions post vaccine: Marland Kitchen Difficulty breathing  . Swelling of face and throat  . A fast heartbeat  . A bad rash all over body  . Dizziness and weakness   Immunizations Administered    Name Date Dose VIS Date Route   Pfizer COVID-19 Vaccine 09/01/2019  8:54 AM 0.3 mL 06/02/2019 Intramuscular   Manufacturer: [place]   Lot: [id number]   [place]: [id number]

## 2019-09-27 ENCOUNTER — Ambulatory Visit: Payer: Managed Care, Other (non HMO) | Attending: Internal Medicine

## 2019-09-27 DIAGNOSIS — Z23 Encounter for immunization: Secondary | ICD-10-CM

## 2019-09-27 NOTE — Progress Notes (Signed)
   Covid-19 Vaccination Clinic  Name:  Dellena Zemp    MRN: [id number] DOB: [birth year]  09/27/2019  Ms. Clark-Hall was observed post Covid-19 immunization for 15 minutes without incident. She was provided with Vaccine Information Sheet and instruction to access the V-Safe system.   Ms. Ahumada was instructed to call 911 with any severe reactions post vaccine: Marland Kitchen Difficulty breathing  . Swelling of face and throat  . A fast heartbeat  . A bad rash all over body  . Dizziness and weakness   Immunizations Administered    Name Date Dose VIS Date Route   Pfizer COVID-19 Vaccine 09/27/2019  8:25 AM 0.3 mL 06/02/2019 Intramuscular   Manufacturer: [employer]   Lot: [id number]   Moody: [id number]

## 2020-03-26 DIAGNOSIS — Z23 Encounter for immunization: Secondary | ICD-10-CM | POA: Diagnosis not present

## 2020-05-04 ENCOUNTER — Ambulatory Visit: Payer: Managed Care, Other (non HMO) | Attending: Internal Medicine

## 2020-05-04 DIAGNOSIS — Z23 Encounter for immunization: Secondary | ICD-10-CM

## 2020-05-04 NOTE — Progress Notes (Signed)
   Covid-19 Vaccination Clinic  Name:  Mescal Flinchbaugh    MRN: [id number] DOB: 01-16-1971  05/04/2020  Ms. Clark-Hall was observed post Covid-19 immunization for 15 minutes without incident. She was provided with Vaccine Information Sheet and instruction to access the V-Safe system.   Ms. Ozanich was instructed to call 911 with any severe reactions post vaccine: Marland Kitchen Difficulty breathing  . Swelling of face and throat  . A fast heartbeat  . A bad rash all over body  . Dizziness and weakness

## 2020-07-12 DIAGNOSIS — Z1231 Encounter for screening mammogram for malignant neoplasm of breast: Secondary | ICD-10-CM | POA: Diagnosis not present

## 2020-10-14 DIAGNOSIS — Z23 Encounter for immunization: Secondary | ICD-10-CM | POA: Diagnosis not present

## 2020-10-14 DIAGNOSIS — I10 Essential (primary) hypertension: Secondary | ICD-10-CM | POA: Diagnosis not present

## 2020-10-14 DIAGNOSIS — L659 Nonscarring hair loss, unspecified: Secondary | ICD-10-CM | POA: Diagnosis not present

## 2020-10-14 DIAGNOSIS — E039 Hypothyroidism, unspecified: Secondary | ICD-10-CM | POA: Diagnosis not present

## 2020-10-14 DIAGNOSIS — E559 Vitamin D deficiency, unspecified: Secondary | ICD-10-CM | POA: Diagnosis not present

## 2020-10-14 DIAGNOSIS — Z0000 Encounter for general adult medical examination without abnormal findings: Secondary | ICD-10-CM | POA: Diagnosis not present

## 2021-02-20 DIAGNOSIS — J019 Acute sinusitis, unspecified: Secondary | ICD-10-CM | POA: Diagnosis not present

## 2021-02-20 DIAGNOSIS — K1379 Other lesions of oral mucosa: Secondary | ICD-10-CM | POA: Diagnosis not present

## 2021-02-20 DIAGNOSIS — J029 Acute pharyngitis, unspecified: Secondary | ICD-10-CM | POA: Diagnosis not present

## 2021-03-03 DIAGNOSIS — D259 Leiomyoma of uterus, unspecified: Secondary | ICD-10-CM | POA: Diagnosis not present

## 2021-03-03 DIAGNOSIS — R1900 Intra-abdominal and pelvic swelling, mass and lump, unspecified site: Secondary | ICD-10-CM | POA: Diagnosis not present

## 2021-03-03 DIAGNOSIS — Z01419 Encounter for gynecological examination (general) (routine) without abnormal findings: Secondary | ICD-10-CM | POA: Diagnosis not present

## 2021-03-03 DIAGNOSIS — Z6827 Body mass index (BMI) 27.0-27.9, adult: Secondary | ICD-10-CM | POA: Diagnosis not present

## 2021-07-18 DIAGNOSIS — Z1231 Encounter for screening mammogram for malignant neoplasm of breast: Secondary | ICD-10-CM | POA: Diagnosis not present

## 2021-08-07 DIAGNOSIS — D259 Leiomyoma of uterus, unspecified: Secondary | ICD-10-CM | POA: Diagnosis not present

## 2022-01-09 DIAGNOSIS — E039 Hypothyroidism, unspecified: Secondary | ICD-10-CM | POA: Diagnosis not present

## 2022-01-09 DIAGNOSIS — I10 Essential (primary) hypertension: Secondary | ICD-10-CM | POA: Diagnosis not present

## 2022-01-09 DIAGNOSIS — Z13220 Encounter for screening for lipoid disorders: Secondary | ICD-10-CM | POA: Diagnosis not present

## 2022-01-09 DIAGNOSIS — E559 Vitamin D deficiency, unspecified: Secondary | ICD-10-CM | POA: Diagnosis not present

## 2022-01-21 DIAGNOSIS — I10 Essential (primary) hypertension: Secondary | ICD-10-CM | POA: Diagnosis not present

## 2022-01-21 DIAGNOSIS — E559 Vitamin D deficiency, unspecified: Secondary | ICD-10-CM | POA: Diagnosis not present

## 2022-01-21 DIAGNOSIS — Z23 Encounter for immunization: Secondary | ICD-10-CM | POA: Diagnosis not present

## 2022-01-21 DIAGNOSIS — E039 Hypothyroidism, unspecified: Secondary | ICD-10-CM | POA: Diagnosis not present

## 2022-01-21 DIAGNOSIS — Z0000 Encounter for general adult medical examination without abnormal findings: Secondary | ICD-10-CM | POA: Diagnosis not present

## 2022-01-21 DIAGNOSIS — E663 Overweight: Secondary | ICD-10-CM | POA: Diagnosis not present

## 2022-01-23 DIAGNOSIS — D259 Leiomyoma of uterus, unspecified: Secondary | ICD-10-CM | POA: Diagnosis not present

## 2022-01-27 ENCOUNTER — Other Ambulatory Visit: Payer: Self-pay | Admitting: Obstetrics & Gynecology

## 2022-01-27 DIAGNOSIS — D259 Leiomyoma of uterus, unspecified: Secondary | ICD-10-CM

## 2022-02-04 ENCOUNTER — Encounter: Payer: Self-pay | Admitting: *Deleted

## 2022-02-04 ENCOUNTER — Other Ambulatory Visit: Payer: Self-pay | Admitting: Interventional Radiology

## 2022-02-04 ENCOUNTER — Ambulatory Visit
Admission: RE | Admit: 2022-02-04 | Discharge: 2022-02-04 | Disposition: A | Discharge status: Home / Self Care | Payer: BC Managed Care – PPO | Source: Ambulatory Visit | Attending: Obstetrics & Gynecology | Admitting: Obstetrics & Gynecology

## 2022-02-04 DIAGNOSIS — D259 Leiomyoma of uterus, unspecified: Secondary | ICD-10-CM | POA: Diagnosis not present

## 2022-02-04 DIAGNOSIS — N921 Excessive and frequent menstruation with irregular cycle: Secondary | ICD-10-CM | POA: Diagnosis not present

## 2022-02-04 HISTORY — PX: IR RADIOLOGIST EVAL & MGMT: IMG5224

## 2022-02-04 NOTE — Consult Note (Signed)
Chief Complaint: Symptomatic Uterine Fibroids  Referring Physician(s): [place]  PCP: Dr. Kathyrn Lass   History of Present Illness: Melissa Cummings is a 51 y.o. female presenting today to [facility], kindly referred by Dr. Benjie Karvonen, for evaluation of her uterine fibroids and possible candidacy for uterine artery embolization.   Melissa Cummings has complaints that cover all 3 categories of symptoms of fibroids, bleeding, bulk and pain.   She tells me that regarding her bleeding symptoms, she has had worsening over the past few years, and she has undergone prior endometrial ablation in 2018.  Her bleeding cycles are somewhat irregular now given that she is prescribed Myfembree.  Previously, she experienced cyclical heavy bleeding with clotting during each period, with frequent pad/tampon changes.  She tells me she has even experienced such heavy bleeding that she has ruined her clothes on occasion.    Regarding pain, she describes achy low back and pelvic pain. Regarding bulk symptoms she has complaints of urinary frequency, as well as some painful bowel movements.   She has 3 children, and 1 grandson.  She has no intention of further pregnancy.  She is married.  Her family is local.   Negative pap smear: 03/01/20 Negative endometrial bx: 12/22/2016 [place] in office August 2023: several fibroids, ranging 2cm - 7.1cm.  volume ~600cc.       Past Medical History:  Diagnosis Date   Hypertension    Hypothyroidism    SVD (spontaneous vaginal delivery)    x 3   Thyroid disease    Vitamin D deficiency     Past Surgical History:  Procedure Laterality Date   DILITATION & CURRETTAGE/HYSTROSCOPY WITH HYDROTHERMAL ABLATION N/A 01/22/2017   Procedure: DILATATION & CURETTAGE/HYSTEROSCOPY WITH HYDROTHERMAL ABLATION;  Surgeon: Azucena Fallen, MD;  Location: [facility];  Service: Gynecology;  Laterality: N/A;   IR RADIOLOGIST EVAL & MGMT  8/33/8250   UMBILICAL HERNIA REPAIR     age 69 yrs    40 TOOTH EXTRACTION      Allergies: Patient has no known allergies.  Medications: Prior to Admission medications   Medication Sig Start Date End Date Taking? Authorizing Provider  CAMILA 0.35 MG tablet Take 1 tablet by mouth daily.  01/13/17   [provider]  ibuprofen (ADVIL) 200 MG tablet Take 3 tablets (600 mg total) by mouth every 6 (six) hours as needed. 01/22/17   Azucena Fallen, MD  levothyroxine (SYNTHROID, LEVOTHROID) 150 MCG tablet Take 150 mcg by mouth daily. 10/30/16   [provider]  lisinopril (PRINIVIL,ZESTRIL) 20 MG tablet Take 20 mg by mouth at bedtime.  10/30/16   [provider]  naproxen sodium (ANAPROX) 220 MG tablet Take 220 mg by mouth daily as needed (pain).    [provider]  oxyCODONE-acetaminophen (ROXICET) 5-325 MG tablet Take 1-2 tablets by mouth every 6 (six) hours as needed for severe pain. 01/22/17   Azucena Fallen, MD  Probiotic Product (PROBIOTIC PO) Take 1 tablet by mouth daily.    [provider]  Vitamin D, Ergocalciferol, (DRISDOL) 50000 units CAPS capsule Take 50,000 Units by mouth once a week. Mondays 12/26/16   [provider]     No family history on file.  Social History   Socioeconomic History   Marital status: Married    Spouse name: Not on file   Number of children: Not on file   Years of education: Not on file   Highest education level: Not on file  Occupational History   Not on  file  Tobacco Use   Smoking status: Never   Smokeless tobacco: Never  Vaping Use   Vaping Use: Never used  Substance and Sexual Activity   Alcohol use: Yes    Comment: occasional wine   Drug use: No   Sexual activity: Not on file  Other Topics Concern   Not on file  Social History Narrative   Not on file   Social Determinants of Health   Financial Resource Strain: Not on file  Food Insecurity: Not on file  Transportation Needs: Not on file  Physical Activity: Not on file  Stress: Not on file   Social Connections: Not on file       Review of Systems: A 12 point ROS discussed and pertinent positives are indicated in the HPI above.  All other systems are negative.  Review of Systems  Vital Signs: BP 129/80 (BP Location: Right Arm)   Pulse 86   SpO2 98%     Physical Exam General: 51 yo AA female appearing stated age.  Well-developed, well-nourished.  No distress. HEENT: Atraumatic, normocephalic.  Conjugate gaze, extra-ocular motor intact. Wearing glasses.  No scleral icterus or scleral injection. No lesions on external ears, nose, lips, or gums.  Oral mucosa moist, pink.  Neck: Symmetric with no goiter enlargement.  Chest/Lungs:  Symmetric chest with inspiration/expiration.  No labored breathing.  Clear to auscultation with no wheezes, rhonchi, or rales.  Heart:  RRR, with no third heart sounds appreciated. No JVD appreciated.  Abdomen:  Soft, NT/ND, with + bowel sounds.   Genito-urinary: Deferred Neurologic: Right handed. Alert & Oriented to person, place, and time.   Normal affect and insight.  Appropriate questions.  Moving all 4 extremities with gross sensory intact.  Pulse Exam:  No bruit appreciated.  Palpable radial pulses.  Extremities: No wound or swelling.    Mallampati Score:     Imaging: IR Radiologist Eval & Mgmt  Result Date: 02/04/2022 Please refer to notes tab for details about interventional procedure. (Op Note)   Labs:  CBC: No results for input(s): "WBC", "HGB", "HCT", "PLT" in the last 8760 hours.  COAGS: No results for input(s): "INR", "APTT" in the last 8760 hours.  BMP: No results for input(s): "NA", "K", "CL", "CO2", "GLUCOSE", "BUN", "CALCIUM", "CREATININE", "GFRNONAA", "GFRAA" in the last 8760 hours.  Invalid input(s): "CMP"  LIVER FUNCTION TESTS: No results for input(s): "BILITOT", "AST", "ALT", "ALKPHOS", "PROT", "ALBUMIN" in the last 8760 hours.  TUMOR MARKERS: No results for input(s): "AFPTM", "CEA", "CA199", "CHROMGRNA" in  the last 8760 hours.  Assessment and Plan:   Melissa Cummings is a delightful 51 yo female with symptomatic uterine fibroids, and is a candidate for uterine artery embolization.   I had a lengthy discussion with her regarding the anatomy, pathology, and treatment options for fibroids.  Specifically, I mentioned hormonal therapy, surgical hysterectomy/myomectomy, uterine fibroid embolization, and HIFU.    Regarding UFE, we had a discussion regarding the efficacy, expectations regarding outcomes/long term efficacy, and risk/benefit.   I shared with her a summary of the literature for UFE efficacy, which generally is accepted to be adequate symptom relief with good restoration of quality of life at 1 year in 90% or greater of patients for bleeding>pain> bulk symptoms.  I also told her that there is a cited rate of retreatment in ~15-20% of patients in the long term, typically with younger patients being higher risk for retreatment, with overall excellent long term utility of symptom relief and QOL.  We discussed the expectations not just for the treatment day/23 hour observation, but the first 3 months, 6 months - 12 months, and our follow up.   We briefly discussed superior hypogastric nerve block as adjunct.  Regarding risks, specific risks discussed include: post-embolization syndrome, bleeding, infection, contrast reaction, kidney/artery injury, need for further surgery/procedure, including hysterectomy, need for hospitalization, cardiopulmonary collapse, death.    Regarding post-embolization syndrome, I did let her know that this is essentially expected, with a typical prodromal syndrome lasting 4-7 days typically, and usually treated with medication/support such as hydration, rest, analgesics, PO nausea medications, and stool softeners.   We also discussed the need for MRI and possibility that she may be excluded by findings, or possibly requiring a follow up conversation if we feel that  adenomyosis is present/contributing.  After discussing, she would like to proceed with treatment.   Plan: - plan for MRI to evaluate uterus/fibroids - plan tentatively to proceed with uterine artery embolization at first available, with Dr. Earleen Newport.  With superior hypogastric nerve block.    Thank you for this interesting consult.  I greatly enjoyed meeting Melissa Cummings and look forward to participating in their care.  A copy of this report was sent to the requesting provider on this date.  Electronically Signed: Corrie Mckusick 02/04/2022, 11:15 AM   I spent a total of  60 Minutes   in face to face in clinical consultation, greater than 50% of which was counseling/coordinating care for symptomatic uterine fibroids, possible uterine artery embolization

## 2022-02-14 ENCOUNTER — Other Ambulatory Visit (HOSPITAL_COMMUNITY): Payer: BC Managed Care – PPO

## 2022-02-14 ENCOUNTER — Other Ambulatory Visit: Payer: BC Managed Care – PPO

## 2022-02-18 ENCOUNTER — Other Ambulatory Visit: Payer: Self-pay | Admitting: Obstetrics & Gynecology

## 2022-02-18 DIAGNOSIS — D259 Leiomyoma of uterus, unspecified: Secondary | ICD-10-CM

## 2022-03-08 ENCOUNTER — Ambulatory Visit
Admission: RE | Admit: 2022-03-08 | Discharge: 2022-03-08 | Disposition: A | Discharge status: Home / Self Care | Payer: BC Managed Care – PPO | Source: Ambulatory Visit | Attending: Obstetrics & Gynecology | Admitting: Obstetrics & Gynecology

## 2022-03-08 DIAGNOSIS — N852 Hypertrophy of uterus: Secondary | ICD-10-CM | POA: Diagnosis not present

## 2022-03-08 DIAGNOSIS — D259 Leiomyoma of uterus, unspecified: Secondary | ICD-10-CM

## 2022-03-08 DIAGNOSIS — D251 Intramural leiomyoma of uterus: Secondary | ICD-10-CM | POA: Diagnosis not present

## 2022-03-08 MED ORDER — GADOBENATE DIMEGLUMINE 529 MG/ML IV SOLN
17.0000 mL | Freq: Once | INTRAVENOUS | Status: AC | PRN
  Administered 2022-03-08: 17 mL via INTRAVENOUS

## 2022-03-24 ENCOUNTER — Other Ambulatory Visit: Payer: Self-pay | Admitting: Interventional Radiology

## 2022-03-24 ENCOUNTER — Other Ambulatory Visit (HOSPITAL_COMMUNITY): Payer: Self-pay | Admitting: Interventional Radiology

## 2022-03-24 DIAGNOSIS — D259 Leiomyoma of uterus, unspecified: Secondary | ICD-10-CM

## 2022-05-04 ENCOUNTER — Other Ambulatory Visit: Payer: Self-pay | Admitting: Radiology

## 2022-05-04 DIAGNOSIS — D259 Leiomyoma of uterus, unspecified: Secondary | ICD-10-CM

## 2022-05-04 NOTE — H&P (Signed)
Referring Physician(s): Mody,V  Supervising Physician: Corrie Mckusick  Patient Status:  [facility]  Chief Complaint:  Symptomatic uterine fibroids  Subjective: Pt known to IR service from consultation with Dr. Earleen Newport on 02/04/22 to discuss treatment options for symptomatic uterine fibroids. She was deemed an appropriate candidate for bilateral uterine artery embolization with superior hypogastric nerve block and presents today for the procedure. PMH also sig for HTN, hypothyroidism, vit D deficiency, prior endometrial ablation, umbilical hernia repair age 51.   Allergies: Patient has no known allergies.  Medications: Prior to Admission medications   Medication Sig Start Date End Date Taking? Authorizing Provider  CAMILA 0.35 MG tablet Take 1 tablet by mouth daily.  01/13/17   [provider]  ibuprofen (ADVIL) 200 MG tablet Take 3 tablets (600 mg total) by mouth every 6 (six) hours as needed. 01/22/17   Azucena Fallen, MD  levothyroxine (SYNTHROID, LEVOTHROID) 150 MCG tablet Take 150 mcg by mouth daily. 10/30/16   [provider]  lisinopril (PRINIVIL,ZESTRIL) 20 MG tablet Take 20 mg by mouth at bedtime.  10/30/16   [provider]  naproxen sodium (ANAPROX) 220 MG tablet Take 220 mg by mouth daily as needed (pain).    [provider]  oxyCODONE-acetaminophen (ROXICET) 5-325 MG tablet Take 1-2 tablets by mouth every 6 (six) hours as needed for severe pain. 01/22/17   Azucena Fallen, MD  Probiotic Product (PROBIOTIC PO) Take 1 tablet by mouth daily.    [provider]  Vitamin D, Ergocalciferol, (DRISDOL) 50000 units CAPS capsule Take 50,000 Units by mouth once a week. Mondays 12/26/16   [provider]     Vital Signs:   Physical Exam  Imaging: No results found.  Labs:  CBC: No results for input(s): "WBC", "HGB", "HCT", "PLT" in the last 8760 hours.  COAGS: No results for input(s): "INR", "APTT" in the last 8760  hours.  BMP: No results for input(s): "NA", "K", "CL", "CO2", "GLUCOSE", "BUN", "CALCIUM", "CREATININE", "GFRNONAA", "GFRAA" in the last 8760 hours.  Invalid input(s): "CMP"  LIVER FUNCTION TESTS: No results for input(s): "BILITOT", "AST", "ALT", "ALKPHOS", "PROT", "ALBUMIN" in the last 8760 hours.  Assessment and Plan: Pt known to IR service from consultation with Dr. Earleen Newport on 02/04/22 to discuss treatment options for symptomatic uterine fibroids. She was deemed an appropriate candidate for bilateral uterine artery embolization with superior hypogastric nerve block and presents today for the procedure. PMH also sig for HTN, hypothyroidism, vit D deficiency, prior endometrial ablation, umbilical hernia repair age 51.Risks and benefits of procedure were discussed with the patient including, but not limited to bleeding, infection, vascular injury or contrast induced renal failure.  This interventional procedure involves the use of X-rays and because of the nature of the planned procedure, it is possible that we will have prolonged use of X-ray fluoroscopy.  Potential radiation risks to you include (but are not limited to) the following: - A slightly elevated risk for cancer  several years later in life. This risk is typically less than 0.5% percent. This risk is low in comparison to the normal incidence of human cancer, which is 33% for women and 50% for men according to the [place]. - Radiation induced injury can include skin redness, resembling a rash, tissue breakdown / ulcers and hair loss (which can be temporary or permanent).   The likelihood of either of these occurring depends on the difficulty of the procedure and whether you are sensitive to radiation due to previous  procedures, disease, or genetic conditions.   IF your procedure requires a prolonged use of radiation, you will be notified and given written instructions for further action.  It is your responsibility to  monitor the irradiated area for the 2 weeks following the procedure and to notify your physician if you are concerned that you have suffered a radiation induced injury.    All of the patient's questions were answered, patient is agreeable to proceed.  Consent signed and in chart.      Electronically Signed: D. Rowe Robert, PA-C 05/04/2022, 7:35 PM   I spent a total of 30 minutes at the the patient's bedside AND on the patient's hospital floor or unit, greater than 50% of which was counseling/coordinating care for bilateral uterine artery embolization with superior hypogastric nerve block

## 2022-05-05 ENCOUNTER — Ambulatory Visit (HOSPITAL_COMMUNITY)
Admission: RE | Admit: 2022-05-05 | Discharge: 2022-05-05 | Disposition: A | Discharge status: Home / Self Care | Payer: BC Managed Care – PPO | Source: Ambulatory Visit

## 2022-05-05 ENCOUNTER — Ambulatory Visit (HOSPITAL_COMMUNITY)
Admission: RE | Admit: 2022-05-05 | Discharge: 2022-05-05 | Disposition: A | Discharge status: Home / Self Care | Payer: BC Managed Care – PPO | Source: Ambulatory Visit | Attending: Interventional Radiology | Admitting: Interventional Radiology

## 2022-05-05 ENCOUNTER — Other Ambulatory Visit: Payer: Self-pay

## 2022-05-05 ENCOUNTER — Other Ambulatory Visit (HOSPITAL_COMMUNITY): Payer: Self-pay | Admitting: Interventional Radiology

## 2022-05-05 ENCOUNTER — Encounter (HOSPITAL_COMMUNITY): Payer: Self-pay

## 2022-05-05 ENCOUNTER — Other Ambulatory Visit (HOSPITAL_COMMUNITY): Payer: Self-pay

## 2022-05-05 DIAGNOSIS — I10 Essential (primary) hypertension: Secondary | ICD-10-CM | POA: Diagnosis not present

## 2022-05-05 DIAGNOSIS — D259 Leiomyoma of uterus, unspecified: Secondary | ICD-10-CM | POA: Diagnosis not present

## 2022-05-05 DIAGNOSIS — Z86018 Personal history of other benign neoplasm: Secondary | ICD-10-CM

## 2022-05-05 DIAGNOSIS — E039 Hypothyroidism, unspecified: Secondary | ICD-10-CM | POA: Insufficient documentation

## 2022-05-05 HISTORY — PX: IR ANGIOGRAM PELVIS SELECTIVE OR SUPRASELECTIVE: IMG661

## 2022-05-05 HISTORY — PX: IR US GUIDE VASC ACCESS RIGHT: IMG2390

## 2022-05-05 HISTORY — PX: IR FLUORO GUIDED NEEDLE PLC ASPIRATION/INJECTION LOC: IMG2395

## 2022-05-05 HISTORY — PX: IR EMBO TUMOR ORGAN ISCHEMIA INFARCT INC GUIDE ROADMAPPING: IMG5449

## 2022-05-05 HISTORY — PX: IR ANGIOGRAM SELECTIVE EACH ADDITIONAL VESSEL: IMG667

## 2022-05-05 LAB — PROTIME-INR
INR: 1 (ref 0.8–1.2)
Prothrombin Time: 13 seconds (ref 11.4–15.2)

## 2022-05-05 LAB — CBC WITH DIFFERENTIAL/PLATELET
Abs Immature Granulocytes: 0.01 10*3/uL (ref 0.00–0.07)
Basophils Absolute: 0.1 10*3/uL (ref 0.0–0.1)
Basophils Relative: 1 %
Eosinophils Absolute: 0.2 10*3/uL (ref 0.0–0.5)
Eosinophils Relative: 2 %
HCT: 41 % (ref 36.0–46.0)
Hemoglobin: 13.2 g/dL (ref 12.0–15.0)
Immature Granulocytes: 0 %
Lymphocytes Relative: 38 %
Lymphs Abs: 2.5 10*3/uL (ref 0.7–4.0)
MCH: 26.4 pg (ref 26.0–34.0)
MCHC: 32.2 g/dL (ref 30.0–36.0)
MCV: 82 fL (ref 80.0–100.0)
Monocytes Absolute: 0.4 10*3/uL (ref 0.1–1.0)
Monocytes Relative: 6 %
Neutro Abs: 3.6 10*3/uL (ref 1.7–7.7)
Neutrophils Relative %: 53 %
Platelets: 346 10*3/uL (ref 150–400)
RBC: 5 MIL/uL (ref 3.87–5.11)
RDW: 12.7 % (ref 11.5–15.5)
WBC: 6.7 10*3/uL (ref 4.0–10.5)
nRBC: 0 % (ref 0.0–0.2)

## 2022-05-05 LAB — BASIC METABOLIC PANEL
Anion gap: 8 (ref 5–15)
BUN: 21 mg/dL — ABNORMAL HIGH (ref 6–20)
CO2: 29 mmol/L (ref 22–32)
Calcium: 9.3 mg/dL (ref 8.9–10.3)
Chloride: 103 mmol/L (ref 98–111)
Creatinine, Ser: 0.65 mg/dL (ref 0.44–1.00)
GFR, Estimated: >60 mL/min (ref >60)
Glucose, Bld: 106 mg/dL — ABNORMAL HIGH (ref 70–99)
Potassium: 4 mmol/L (ref 3.5–5.1)
Sodium: 140 mmol/L (ref 135–145)

## 2022-05-05 LAB — HCG, SERUM, QUALITATIVE: Preg, Serum: NEGATIVE

## 2022-05-05 MED ORDER — SODIUM CHLORIDE 0.9 % IV SOLN: INTRAVENOUS | Status: DC

## 2022-05-05 MED ORDER — PROMETHAZINE HCL 12.5 MG PO TABS
12.5000 mg | ORAL_TABLET | ORAL | 0 refills | Status: AC | PRN
  Filled 2022-05-05: qty 30, 5d supply, fill #0

## 2022-05-05 MED ORDER — PANTOPRAZOLE SODIUM 40 MG IV SOLR
40.0000 mg | Freq: Once | INTRAVENOUS | Status: AC
  Administered 2022-05-05: 40 mg via INTRAVENOUS
  Filled 2022-05-05: qty 10

## 2022-05-05 MED ORDER — HYDROMORPHONE HCL 1 MG/ML IJ SOLN
1.0000 mg | Freq: Once | INTRAMUSCULAR | Status: AC
  Administered 2022-05-05: 1 mg via INTRAVENOUS

## 2022-05-05 MED ORDER — KETOROLAC TROMETHAMINE 60 MG/2ML IM SOLN
60.0000 mg | Freq: Once | INTRAMUSCULAR | Status: AC
  Administered 2022-05-05: 60 mg via INTRAMUSCULAR
  Filled 2022-05-05: qty 2

## 2022-05-05 MED ORDER — LACTATED RINGERS IV SOLN: INTRAVENOUS | Status: DC

## 2022-05-05 MED ORDER — CEFAZOLIN SODIUM-DEXTROSE 2-4 GM/100ML-% IV SOLN
2.0000 g | INTRAVENOUS | Status: AC
  Administered 2022-05-05: 2 g via INTRAVENOUS
  Filled 2022-05-05: qty 100

## 2022-05-05 MED ORDER — ROPIVACAINE HCL 5 MG/ML IJ SOLN
INTRAMUSCULAR | Status: AC
  Administered 2022-05-05: 15 mL
  Filled 2022-05-05: qty 30

## 2022-05-05 MED ORDER — OXYCODONE-ACETAMINOPHEN 5-325 MG PO TABS
ORAL_TABLET | ORAL | Status: AC
  Filled 2022-05-05: qty 1

## 2022-05-05 MED ORDER — MIDAZOLAM HCL 2 MG/2ML IJ SOLN
INTRAMUSCULAR | Status: AC
  Filled 2022-05-05: qty 2

## 2022-05-05 MED ORDER — MIDAZOLAM HCL 2 MG/2ML IJ SOLN
INTRAMUSCULAR | Status: AC | PRN
  Administered 2022-05-05 (×2): 1 mg via INTRAVENOUS

## 2022-05-05 MED ORDER — KETOROLAC TROMETHAMINE 30 MG/ML IJ SOLN
30.0000 mg | INTRAMUSCULAR | Status: AC
  Filled 2022-05-05: qty 1

## 2022-05-05 MED ORDER — NAPROXEN 500 MG PO TABS
500.0000 mg | ORAL_TABLET | Freq: Two times a day (BID) | ORAL | 0 refills | Status: AC
  Filled 2022-05-05: qty 14, 7d supply, fill #0

## 2022-05-05 MED ORDER — SODIUM CHLORIDE 0.9 % IV SOLN
8.0000 mg | Freq: Once | INTRAVENOUS | Status: AC
  Administered 2022-05-05: 8 mg via INTRAVENOUS
  Filled 2022-05-05: qty 4

## 2022-05-05 MED ORDER — HYDROMORPHONE HCL 1 MG/ML IJ SOLN
INTRAMUSCULAR | Status: AC
  Filled 2022-05-05: qty 1

## 2022-05-05 MED ORDER — ONDANSETRON HCL 4 MG/2ML IJ SOLN
4.0000 mg | Freq: Once | INTRAMUSCULAR | Status: AC
  Administered 2022-05-05: 4 mg via INTRAVENOUS

## 2022-05-05 MED ORDER — ONDANSETRON HCL 4 MG/2ML IJ SOLN
INTRAMUSCULAR | Status: AC
  Filled 2022-05-05: qty 2

## 2022-05-05 MED ORDER — FENTANYL CITRATE (PF) 100 MCG/2ML IJ SOLN
INTRAMUSCULAR | Status: AC | PRN
  Administered 2022-05-05 (×2): 50 ug via INTRAVENOUS

## 2022-05-05 MED ORDER — IOPAMIDOL (ISOVUE-M 200) INJECTION 41%
INTRAMUSCULAR | Status: AC
  Administered 2022-05-05: 20 mL
  Filled 2022-05-05: qty 10

## 2022-05-05 MED ORDER — FENTANYL CITRATE (PF) 100 MCG/2ML IJ SOLN
INTRAMUSCULAR | Status: AC
  Filled 2022-05-05: qty 2

## 2022-05-05 MED ORDER — KETOROLAC TROMETHAMINE 30 MG/ML IJ SOLN
INTRAMUSCULAR | Status: AC
  Administered 2022-05-05: 30 mg via INTRAVENOUS
  Filled 2022-05-05: qty 1

## 2022-05-05 MED ORDER — OXYCODONE-ACETAMINOPHEN 7.5-325 MG PO TABS
1.0000 | ORAL_TABLET | ORAL | 0 refills | Status: AC | PRN
  Filled 2022-05-05: qty 30, 5d supply, fill #0

## 2022-05-05 MED ORDER — PROMETHAZINE HCL 12.5 MG PO TABS
12.5000 mg | ORAL_TABLET | Freq: Once | ORAL | Status: AC
  Administered 2022-05-05: 12.5 mg via ORAL
  Filled 2022-05-05 (×2): qty 1

## 2022-05-05 MED ORDER — DEXAMETHASONE SODIUM PHOSPHATE 10 MG/ML IJ SOLN
10.0000 mg | Freq: Once | INTRAMUSCULAR | Status: AC
  Administered 2022-05-05: 10 mg via INTRAVENOUS
  Filled 2022-05-05: qty 1

## 2022-05-05 MED ORDER — ONDANSETRON HCL 8 MG PO TABS
8.0000 mg | ORAL_TABLET | Freq: Three times a day (TID) | ORAL | 0 refills | Status: AC | PRN
  Filled 2022-05-05: qty 20, 7d supply, fill #0

## 2022-05-05 MED ORDER — LIDOCAINE HCL 1 % IJ SOLN
INTRAMUSCULAR | Status: AC
  Administered 2022-05-05: 20 mL
  Filled 2022-05-05: qty 20

## 2022-05-05 MED ORDER — ACETAMINOPHEN 10 MG/ML IV SOLN
1000.0000 mg | Freq: Four times a day (QID) | INTRAVENOUS | Status: DC
  Administered 2022-05-05: 1000 mg via INTRAVENOUS
  Filled 2022-05-05: qty 100

## 2022-05-05 MED ORDER — DOCUSATE SODIUM 100 MG PO CAPS
100.0000 mg | ORAL_CAPSULE | Freq: Two times a day (BID) | ORAL | 0 refills | Status: AC
  Filled 2022-05-05: qty 100, 50d supply, fill #0

## 2022-05-05 MED ORDER — OXYCODONE-ACETAMINOPHEN 7.5-325 MG PO TABS
1.0000 | ORAL_TABLET | Freq: Once | ORAL | Status: AC
  Administered 2022-05-05: 1 via ORAL
  Filled 2022-05-05: qty 1

## 2022-05-05 MED ORDER — IOHEXOL 300 MG/ML  SOLN
100.0000 mL | Freq: Once | INTRAMUSCULAR | Status: AC | PRN
  Administered 2022-05-05: 70 mL via INTRA_ARTERIAL

## 2022-05-05 MED ORDER — DOCUSATE SODIUM 100 MG PO CAPS
100.0000 mg | ORAL_CAPSULE | Freq: Once | ORAL | Status: DC
  Filled 2022-05-05: qty 1

## 2022-05-05 NOTE — Progress Notes (Signed)
Pt. Up to get dressed. Pt. Vomited approx. 200cc clearish liquid. Pt. Requested to continue with discharge. Assisted pt. Out to car via wheelchair.

## 2022-05-05 NOTE — Procedures (Signed)
Interventional Radiology Procedure Note  Procedure:  US guided access right CFA Image guided superior hypogastric nerve block Pelvic angiogram Embolization of bilateral uterine arteries, with 500-737mcron Embospheres CELT for hemostasis   Complications: None EBL: none  Medications: 15cc 0.5% ropivacaine for block  Recommendations:  - Plan for DC in 1 hr after moderate sedation recovery - OK to ambulate in 1 hr after CELT deployment - advance diet now - In recovery: 1 dose po Colace - In recovery: 12.'5mg'$  PO phenergan - Home meds: Colace PO, BID (8am and 8pm) Naproxen '500mg'$  PO, BID scheduled for 7 days (8am and 8pm).  Percocet 7.5/325 PO, Q4-6 hrs x 5 days scheduled Zofran '8mg'$  PO, Q8 hrs x 3 days scheduled.  After 3 days, may take prn Phenergen 12.'5mg'$  PO prn breakthrough nausea Adequate oral hydration - Do not submerge for 7 days - Routine wound care - Follow up with Dr. WEarleen Newportin clinic 3-4 weeks   Signed,  JDulcy Fanny WEarleen Newport DO

## 2022-05-05 NOTE — Progress Notes (Signed)
1252  Pt. Having severe cramping and nausea.   Informed K Allred PA of same. Orders for Zofran. (1255 given 4 mg) 1310 Increasing cramping lower abd; updated Dr. Earleen Newport 1315 in to see pt. Order for Dilaudid for pain.   Will keep pt. Longer recovery.due to cramping/ pain and meds.   Sat dropping to 80-s  placed on [place] 3 l

## 2022-05-05 NOTE — Discharge Instructions (Signed)
Moderate Conscious Sedation, Adult, Care After This sheet gives you information about how to care for yourself after your procedure. Your health care provider may also give you more specific instructions. If you have problems or questions, contact your health care provider. What can I expect after the procedure? After the procedure, it is common to have: Sleepiness for several hours. Impaired judgment for several hours. Difficulty with balance. Vomiting if you eat too soon. Follow these instructions at home: For the time period you were told by your health care provider:     Rest. Do not participate in activities where you could fall or become injured. Do not drive or use machinery. Do not drink alcohol. Do not take sleeping pills or medicines that cause drowsiness. Do not make important decisions or sign legal documents. Do not take care of children on your own. Eating and drinking  Follow the diet recommended by your health care provider. Drink enough fluid to keep your urine pale yellow. If you vomit: Drink water, juice, or soup when you can drink without vomiting. Make sure you have little or no nausea before eating solid foods. General instructions Take over-the-counter and prescription medicines only as told by your health care provider. Have a responsible adult stay with you for the time you are told. It is important to have someone help care for you until you are awake and alert. Do not smoke. Keep all follow-up visits as told by your health care provider. This is important. Contact a health care provider if: You are still sleepy or having trouble with balance after 24 hours. You feel light-headed. You keep feeling nauseous or you keep vomiting. You develop a rash. You have a fever. You have redness or swelling around the IV site. Get help right away if: You have trouble breathing. You have new-onset confusion at home. Summary After the procedure, it is common to  feel sleepy, have impaired judgment, or feel nauseous if you eat too soon. Rest after you get home. Know the things you should not do after the procedure. Follow the diet recommended by your health care provider and drink enough fluid to keep your urine pale yellow. Get help right away if you have trouble breathing or new-onset confusion at home. This information is not intended to replace advice given to you by your health care provider. Make sure you discuss any questions you have with your health care provider. Document Revised: 10/06/2019 Document Reviewed: 05/04/2019 Elsevier Patient Education  [place].    Uterine Artery Embolization for Fibroids, Care After The following information offers guidance on how to care for yourself after your procedure. Your health care provider may also give you more specific instructions. If you have problems or questions, contact your health care provider. What can I expect after the procedure? After the procedure, it is common to have: Pain or discomfort at the incision site. Cramps in the area between your hips (pelvis). Vaginal bleeding. Vaginal discharge. Nausea and vomiting. Follow these instructions at home: Medicines Take over-the-counter and prescription medicines only as told by your health care provider. If you were prescribed an antibiotic medicine, take it as told by your health care provider. Do not stop using the antibiotic even if you start to feel better. If you are taking blood thinners: Talk with your health care provider before you take any medicines that contain aspirin or NSAIDs, such as ibuprofen. These medicines increase your risk for dangerous bleeding. Take your medicine exactly as told, at  the same time every day. Avoid activities that could cause injury or bruising, and follow instructions about how to prevent falls. Wear a medical alert bracelet or carry a card that lists what medicines you take. Ask your health  care provider if the medicine prescribed to you: Requires you to avoid driving or using machinery. Can cause constipation. You may need to take these actions to prevent or treat constipation: Drink enough fluid to keep your urine pale yellow. Take over-the-counter or prescription medicines. Eat foods that are high in fiber, such as beans, whole grains, and fresh fruits and vegetables. Limit foods that are high in fat and processed sugars, such as fried or sweet foods. Incision care  Follow instructions from your health care provider about how to take care of your incision. Make sure you: Wash your hands with soap and water for at least 20 seconds before and after you change your bandage (dressing). If soap and water are not available, use hand sanitizer. Change your dressing as told by your health care provider. Leave stitches (sutures), skin glue, or adhesive strips in place. These skin closures may need to stay in place for 2 weeks or longer. If adhesive strip edges start to loosen and curl up, you may trim the loose edges. Do not remove adhesive strips completely unless your health care provider tells you to do that. Check your incision area every day for signs of infection. Check for: Redness, swelling, or pain. Fluid or blood. Warmth. Pus or a bad smell. Activity Do not lift anything that is heavier than 5 lb (2.3 kg), or the limit that you are told, until your health care provider says that it is safe. Return to your normal activities as told by your health care provider. Ask your health care provider what activities are safe for you. General instructions Do not use any products that contain nicotine or tobacco. These products include cigarettes, chewing tobacco, and vaping devices, such as e-cigarettes. These can delay incision healing. If you need help quitting, ask your health care provider. Do not take baths, swim, or use a hot tub until your health care provider approves. Ask your  health care provider if you may take showers. You may only be allowed to take sponge baths. Do not have sex or put anything in your vagina until your health care provider says that this is safe. Do not drink alcohol until your health care provider says it is okay. Wear compression stockings as told by your health care provider. These stockings help to prevent blood clots and reduce swelling in your legs. Keep all follow-up visits. This is important. Contact a health care provider if: You have a fever. You have more redness, swelling, or pain around your incision. You have more fluid or blood coming from your incision site. Your incision feels warm to the touch. You have pus or a bad smell coming from your incision. You have a rash. You have nausea, or you cannot eat or drink anything without vomiting. You have a vaginal discharge that is not getting lighter. Get help right away if: You have trouble breathing. You have chest pain. You have severe pain in your abdomen, and it does not get better with medicine. You have leg pain or leg swelling. You feel dizzy, or you faint. These symptoms may represent a serious problem that is an emergency. Do not wait to see if the symptoms will go away. Get medical help right away. Call your local emergency services (911  in the [place] Do not drive yourself to the hospital. Summary After the procedure, it is common to have cramps, or pain or discomfort at the incision site. You will be given pain medicine. Follow instructions from your health care provider about how to take care of your incision. Check your incision area every day for signs of infection. Take over-the-counter and prescription medicines only as told by your health care provider. Contact your health care provider if you have symptoms of infection or other symptoms that do not get better with treatment. This information is not intended to replace advice given to you by your health care  provider. Make sure you discuss any questions you have with your health care provider.

## 2022-05-06 ENCOUNTER — Encounter (HOSPITAL_COMMUNITY): Payer: Self-pay | Admitting: Interventional Radiology

## 2022-05-12 ENCOUNTER — Other Ambulatory Visit (HOSPITAL_COMMUNITY): Payer: Self-pay | Admitting: Radiology

## 2022-05-12 DIAGNOSIS — D259 Leiomyoma of uterus, unspecified: Secondary | ICD-10-CM

## 2022-05-27 DIAGNOSIS — Z6828 Body mass index (BMI) 28.0-28.9, adult: Secondary | ICD-10-CM | POA: Diagnosis not present

## 2022-05-27 DIAGNOSIS — Z01419 Encounter for gynecological examination (general) (routine) without abnormal findings: Secondary | ICD-10-CM | POA: Diagnosis not present

## 2022-05-29 ENCOUNTER — Encounter (HOSPITAL_COMMUNITY): Payer: Self-pay | Admitting: Interventional Radiology

## 2022-06-02 ENCOUNTER — Encounter (HOSPITAL_COMMUNITY): Payer: Self-pay | Admitting: Radiology

## 2022-06-02 NOTE — Addendum Note (Signed)
Encounter addended by: Wolfgang Phoenix on: 06/02/2022 1:56 PM  Actions taken: Imaging Exam ended, Charge Capture section accepted

## 2022-06-12 ENCOUNTER — Telehealth: Payer: BC Managed Care – PPO

## 2022-06-12 ENCOUNTER — Ambulatory Visit
Admission: RE | Admit: 2022-06-12 | Discharge: 2022-06-12 | Disposition: A | Discharge status: Home / Self Care | Payer: BC Managed Care – PPO | Source: Ambulatory Visit | Attending: Radiology | Admitting: Radiology

## 2022-06-12 DIAGNOSIS — Z9889 Other specified postprocedural states: Secondary | ICD-10-CM | POA: Diagnosis not present

## 2022-06-12 DIAGNOSIS — D259 Leiomyoma of uterus, unspecified: Secondary | ICD-10-CM | POA: Diagnosis not present

## 2022-06-12 HISTORY — PX: IR RADIOLOGIST EVAL & MGMT: IMG5224

## 2022-06-12 NOTE — Progress Notes (Signed)
Chief Complaint: Symptomatic Uterine Fibroids, post op   Referring Physician(s): Nikolai   PCP: Dr. Kathyrn Lass    History of Present Illness: Melissa Cummings is a 51 y.o. female presenting today to [facility], for post operative visit, SP treatment of uterine fibroids with [place].   History from intake 02/04/22:   Symptoms within 3 categories of symptoms of fibroids, bleeding, bulk and pain.    She tells me that regarding her bleeding symptoms, she has had worsening over the past few years, and she has undergone prior endometrial ablation in 2018.  Her bleeding cycles are somewhat irregular now given that she is prescribed Myfembree.  Previously, she experienced cyclical heavy bleeding with clotting during each period, with frequent pad/tampon changes.  She tells me she has even experienced such heavy bleeding that she has ruined her clothes on occasion.     Regarding pain, she describes achy low back and pelvic pain. Regarding bulk symptoms she has complaints of urinary frequency, as well as some painful bowel movements.    She has 3 children, and 1 grandson.  She has no intention of further pregnancy.  She is married.  Her family is local.    Negative pap smear: 03/01/20 Negative endometrial bx: 12/22/2016 [place] in office August 2023: several fibroids, ranging 2cm - 7.1cm.  volume ~600cc.     Interval History:  We treated Melissa Cummings 11/14 at [facility] hospital with right CFA access, bilateral uterine artery embolization.  She was discharged home same day.   She tells me today that the first few days were uncomfortable, but she denies any concern for high fever or infection.  She tells me she has fully recovered and is basically back to her baseline.  She reports having a menstrual cycle since our treatment, but she tells me that it was "normal", much less severe than previous.  She is very happy with this.   She says she has had no issues with the access site.    She has no  other complaints and seems very satisfied.     Past Medical History:  Diagnosis Date   Hypertension    Hypothyroidism    SVD (spontaneous vaginal delivery)    x 3   Thyroid disease    Vitamin D deficiency     Past Surgical History:  Procedure Laterality Date   DILITATION & CURRETTAGE/HYSTROSCOPY WITH HYDROTHERMAL ABLATION N/A 01/22/2017   Procedure: DILATATION & CURETTAGE/HYSTEROSCOPY WITH HYDROTHERMAL ABLATION;  Surgeon: Azucena Fallen, MD;  Location: [facility];  Service: Gynecology;  Laterality: N/A;   IR ANGIOGRAM PELVIS SELECTIVE OR SUPRASELECTIVE  05/05/2022   IR ANGIOGRAM SELECTIVE EACH ADDITIONAL VESSEL  05/05/2022   IR ANGIOGRAM SELECTIVE EACH ADDITIONAL VESSEL  05/05/2022   IR EMBO TUMOR ORGAN ISCHEMIA INFARCT INC GUIDE ROADMAPPING  05/05/2022   IR RADIOLOGIST EVAL & MGMT  02/04/2022   IR US GUIDE VASC ACCESS RIGHT  16/03/9603   UMBILICAL HERNIA REPAIR     age 13 yrs   8 TOOTH EXTRACTION      Allergies: Patient has no known allergies.  Medications: Prior to Admission medications   Medication Sig Start Date End Date Taking? Authorizing Provider  CAMILA 0.35 MG tablet Take 1 tablet by mouth daily.  01/13/17   [provider]  docusate sodium (COLACE) 100 MG capsule Take 1 capsule (100 mg total) by mouth 2 (two) times daily. 05/05/22   Allred, Darrell K, PA-C  hydrochlorothiazide (HYDRODIURIL) 25 MG tablet Take 25 mg by mouth  daily.    [provider]  ibuprofen (ADVIL) 200 MG tablet Take 3 tablets (600 mg total) by mouth every 6 (six) hours as needed. 01/22/17   Azucena Fallen, MD  levothyroxine (SYNTHROID, LEVOTHROID) 150 MCG tablet Take 150 mcg by mouth daily. 10/30/16   [provider]  lisinopril (PRINIVIL,ZESTRIL) 20 MG tablet Take 20 mg by mouth at bedtime.  10/30/16   [provider]  naproxen (NAPROSYN) 500 MG tablet Take 1 tablet (500 mg total) by mouth 2 (two) times daily with a meal. 05/05/22   Allred, Darrell K, PA-C  naproxen  sodium (ANAPROX) 220 MG tablet Take 220 mg by mouth daily as needed (pain).    [provider]  ondansetron (ZOFRAN) 8 MG tablet Take 1 tablet (8 mg total) by mouth every 8 (eight) hours as needed for nausea or vomiting (take as directed for 3 days then as needed every 8 hours). 05/05/22   Allred, Shirlyn Goltz, PA-C  oxyCODONE-acetaminophen (PERCOCET) 7.5-325 MG tablet Take 1 tablet by mouth every 4 (four) hours as needed for severe pain. 05/05/22   Allred, Shirlyn Goltz, PA-C  Probiotic Product (PROBIOTIC PO) Take 1 tablet by mouth daily.    [provider]  promethazine (PHENERGAN) 12.5 MG tablet Take 1 tablet (12.5 mg total) by mouth every 4 (four) hours as needed for nausea or vomiting. 05/05/22   Allred, Darrell K, PA-C  Vitamin D, Ergocalciferol, (DRISDOL) 50000 units CAPS capsule Take 50,000 Units by mouth once a week. Mondays 12/26/16   [provider]     No family history on file.  Social History   Socioeconomic History   Marital status: Married    Spouse name: Not on file   Number of children: Not on file   Years of education: Not on file   Highest education level: Not on file  Occupational History   Not on file  Tobacco Use   Smoking status: Never   Smokeless tobacco: Never  Vaping Use   Vaping Use: Never used  Substance and Sexual Activity   Alcohol use: Yes    Comment: occasional wine   Drug use: No   Sexual activity: Not on file  Other Topics Concern   Not on file  Social History Narrative   Not on file   Social Determinants of Health   Financial Resource Strain: Not on file  Food Insecurity: Not on file  Transportation Needs: Not on file  Physical Activity: Not on file  Stress: Not on file  Social Connections: Not on file       Review of Systems: A 12 point ROS discussed and pertinent positives are indicated in the HPI above.  All other systems are negative.  Review of Systems  Vital Signs: BP 136/84 (BP Location: Right Arm)    Pulse 89   SpO2 98%     Physical Exam Targeted exam of the access site right CFA shows no bruising/ecchymosis, no palpable lumps/bumps.  I cannot visualize the puncture site, as there was no incision made.  Palpable right CFA pulse.   Mallampati Score:     Imaging: No results found.  Labs:  CBC: Recent Labs    05/05/22 0800  WBC 6.7  HGB 13.2  HCT 41.0  PLT 346    COAGS: Recent Labs    05/05/22 0800  INR 1.0    BMP: Recent Labs    05/05/22 0800  NA 140  K 4.0  CL 103  CO2 29  GLUCOSE  106*  BUN 21*  CALCIUM 9.3  CREATININE 0.65  GFRNONAA >60    LIVER FUNCTION TESTS: No results for input(s): "BILITOT", "AST", "ALT", "ALKPHOS", "PROT", "ALBUMIN" in the last 8760 hours.  TUMOR MARKERS: No results for input(s): "AFPTM", "CEA", "CA199", "CHROMGRNA" in the last 8760 hours.  Assessment and Plan:  Melissa Cummings is 51 yo female presenting today as a scheduled post-op visit after her uterine artery embolization ~ 1 month ago.   She is covering very well and has no complaints.  I did let her know that the normal/light cycle that she experienced is normal, and that we should see more improvement over time as there is involution of the fibroids.    She is clear for sexual activity/intercourse.  We discussed that if she had any discomfort from the presence of the fibroids with intercourse previously, she might not see any change until beyond 6 months.  She understands.   Plan: - Continue current care. - We will tentatively schedule her for another follow up in about 6 months (around May).  She knows that she can call us if she has any problems.   Electronically Signed: Corrie Mckusick 06/12/2022, 9:14 AM   I spent a total of    15 Minutes in face to face in clinical consultation, greater than 50% of which was counseling/coordinating care for post op visit, SP fibroid embolization

## 2022-07-24 DIAGNOSIS — Z1231 Encounter for screening mammogram for malignant neoplasm of breast: Secondary | ICD-10-CM | POA: Diagnosis not present

## 2022-07-28 DIAGNOSIS — N6312 Unspecified lump in the right breast, upper inner quadrant: Secondary | ICD-10-CM | POA: Diagnosis not present

## 2022-07-28 DIAGNOSIS — R922 Inconclusive mammogram: Secondary | ICD-10-CM | POA: Diagnosis not present

## 2022-08-05 DIAGNOSIS — N6001 Solitary cyst of right breast: Secondary | ICD-10-CM | POA: Diagnosis not present

## 2022-08-11 DIAGNOSIS — H66002 Acute suppurative otitis media without spontaneous rupture of ear drum, left ear: Secondary | ICD-10-CM | POA: Diagnosis not present

## 2022-08-11 DIAGNOSIS — J029 Acute pharyngitis, unspecified: Secondary | ICD-10-CM | POA: Diagnosis not present

## 2022-08-11 DIAGNOSIS — Z6827 Body mass index (BMI) 27.0-27.9, adult: Secondary | ICD-10-CM | POA: Diagnosis not present

## 2022-08-11 DIAGNOSIS — R051 Acute cough: Secondary | ICD-10-CM | POA: Diagnosis not present

## 2022-11-04 ENCOUNTER — Ambulatory Visit
Admission: RE | Admit: 2022-11-04 | Discharge: 2022-11-04 | Disposition: A | Discharge status: Home / Self Care | Payer: BC Managed Care – PPO | Source: Ambulatory Visit | Attending: Radiology | Admitting: Radiology

## 2022-11-04 DIAGNOSIS — D259 Leiomyoma of uterus, unspecified: Secondary | ICD-10-CM | POA: Diagnosis not present

## 2022-11-04 HISTORY — PX: IR RADIOLOGIST EVAL & MGMT: IMG5224

## 2022-11-04 NOTE — Progress Notes (Signed)
Chief Complaint: Symptomatic Uterine Fibroids, post op   Referring Physician(s): Mody,Vaishali   PCP: Dr. Sigmund Hazel    History of Present Illness: Melissa Cummings is a 52 y.o. female presenting today to [facility] as scheduled follow up, about 6 months SP treatment of uterine fibroids with [place].    History from intake 02/04/22:   Symptoms within 3 categories of symptoms of fibroids, bleeding, bulk and pain.    She tells me that regarding her bleeding symptoms, she has had worsening over the past few years, and she has undergone prior endometrial ablation in 2018.  Her bleeding cycles are somewhat irregular now given that she is prescribed Myfembree.  Previously, she experienced cyclical heavy bleeding with clotting during each period, with frequent pad/tampon changes.  She tells me she has even experienced such heavy bleeding that she has ruined her clothes on occasion.     Regarding pain, she describes achy low back and pelvic pain. Regarding bulk symptoms she has complaints of urinary frequency, as well as some painful bowel movements.    She has 3 children, and 1 grandson.  She has no intention of further pregnancy.  She is married.  Her family is local.    Negative pap smear: 03/01/20 Negative endometrial bx: 12/22/2016 [place] in office August 2023: several fibroids, ranging 2cm - 7.1cm.  volume ~600cc.       Interval History:  We last saw Melissa Cummings 06/12/22 as a post op visit. .    She tells me today that her symptoms are very much improved and she is quite satisfied.  She is comfortable, has no pain with intercourse, and tells me that she has experienced a significant reduction in the size of the largest fibroid of her lower abdomen.  She previously was able to palpate this, and it has significantly reduced in size, which she is happy with.   She tells me that she is still having regular cycles, and the volume of flow is significantly reduced.  She is able to manage this  with usual tampon/pad changes.  These last less than a week.    She did ask me today if she is 'still ovulating', as she is currently not using birth control.  I did clarify with her that this procedure does not change anything about her ovulation/hormones, and is not a reliable method of contraception.  I did share with her that there is definitely a chance that she could become pregnant if she is not using another reliable method of contraception.  She understands.   She is no longer taking Myfembree.   Overall, she is very satisfied.   Past Medical History:  Diagnosis Date   Hypertension    Hypothyroidism    SVD (spontaneous vaginal delivery)    x 3   Thyroid disease    Vitamin D deficiency     Past Surgical History:  Procedure Laterality Date   DILITATION & CURRETTAGE/HYSTROSCOPY WITH HYDROTHERMAL ABLATION N/A 01/22/2017   Procedure: DILATATION & CURETTAGE/HYSTEROSCOPY WITH HYDROTHERMAL ABLATION;  Surgeon: Shea Evans, MD;  Location: [facility];  Service: Gynecology;  Laterality: N/A;   IR ANGIOGRAM PELVIS SELECTIVE OR SUPRASELECTIVE  05/05/2022   IR ANGIOGRAM SELECTIVE EACH ADDITIONAL VESSEL  05/05/2022   IR ANGIOGRAM SELECTIVE EACH ADDITIONAL VESSEL  05/05/2022   IR EMBO TUMOR ORGAN ISCHEMIA INFARCT INC GUIDE ROADMAPPING  05/05/2022   IR RADIOLOGIST EVAL & MGMT  02/04/2022   IR RADIOLOGIST EVAL & MGMT  06/12/2022   IR US  GUIDE VASC ACCESS RIGHT  05/05/2022   UMBILICAL HERNIA REPAIR     age 55 yrs   WISDOM TOOTH EXTRACTION      Allergies: Patient has no known allergies.  Medications: Prior to Admission medications   Medication Sig Start Date End Date Taking? Authorizing Provider  CAMILA 0.35 MG tablet Take 1 tablet by mouth daily.  01/13/17   [provider]  docusate sodium (COLACE) 100 MG capsule Take 1 capsule (100 mg total) by mouth 2 (two) times daily. 05/05/22   Allred, Darrell K, PA-C  hydrochlorothiazide (HYDRODIURIL) 25 MG tablet Take 25 mg by mouth daily.     [provider]  ibuprofen (ADVIL) 200 MG tablet Take 3 tablets (600 mg total) by mouth every 6 (six) hours as needed. 01/22/17   Shea Evans, MD  levothyroxine (SYNTHROID, LEVOTHROID) 150 MCG tablet Take 150 mcg by mouth daily. 10/30/16   [provider]  lisinopril (PRINIVIL,ZESTRIL) 20 MG tablet Take 20 mg by mouth at bedtime.  10/30/16   [provider]  naproxen (NAPROSYN) 500 MG tablet Take 1 tablet (500 mg total) by mouth 2 (two) times daily with a meal. 05/05/22   Allred, Darrell K, PA-C  naproxen sodium (ANAPROX) 220 MG tablet Take 220 mg by mouth daily as needed (pain).    [provider]  ondansetron (ZOFRAN) 8 MG tablet Take 1 tablet (8 mg total) by mouth every 8 (eight) hours as needed for nausea or vomiting (take as directed for 3 days then as needed every 8 hours). 05/05/22   Allred, Rosalita Levan, PA-C  oxyCODONE-acetaminophen (PERCOCET) 7.5-325 MG tablet Take 1 tablet by mouth every 4 (four) hours as needed for severe pain. 05/05/22   Allred, Rosalita Levan, PA-C  Probiotic Product (PROBIOTIC PO) Take 1 tablet by mouth daily.    [provider]  promethazine (PHENERGAN) 12.5 MG tablet Take 1 tablet (12.5 mg total) by mouth every 4 (four) hours as needed for nausea or vomiting. 05/05/22   Allred, Darrell K, PA-C  Vitamin D, Ergocalciferol, (DRISDOL) 50000 units CAPS capsule Take 50,000 Units by mouth once a week. Mondays 12/26/16   [provider]     No family history on file.  Social History   Socioeconomic History   Marital status: Married    Spouse name: Not on file   Number of children: Not on file   Years of education: Not on file   Highest education level: Not on file  Occupational History   Not on file  Tobacco Use   Smoking status: Never   Smokeless tobacco: Never  Vaping Use   Vaping Use: Never used  Substance and Sexual Activity   Alcohol use: Yes    Comment: occasional wine   Drug use: No   Sexual activity: Not  on file  Other Topics Concern   Not on file  Social History Narrative   Not on file   Social Determinants of Health   Financial Resource Strain: Not on file  Food Insecurity: Not on file  Transportation Needs: Not on file  Physical Activity: Not on file  Stress: Not on file  Social Connections: Not on file       Review of Systems  Review of Systems: A 12 point ROS discussed and pertinent positives are indicated in the HPI above.  All other systems are negative.    Physical Exam No direct physical exam was performed (except for noted visual exam findings with Video Visits).  Vital Signs: There were no vitals taken for this visit.  Imaging: No results found.  Labs:  CBC: Recent Labs    05/05/22 0800  WBC 6.7  HGB 13.2  HCT 41.0  PLT 346    COAGS: Recent Labs    05/05/22 0800  INR 1.0    BMP: Recent Labs    05/05/22 0800  NA 140  K 4.0  CL 103  CO2 29  GLUCOSE 106*  BUN 21*  CALCIUM 9.3  CREATININE 0.65  GFRNONAA >60    LIVER FUNCTION TESTS: No results for input(s): "BILITOT", "AST", "ALT", "ALKPHOS", "PROT", "ALBUMIN" in the last 8760 hours.  TUMOR MARKERS: No results for input(s): "AFPTM", "CEA", "CA199", "CHROMGRNA" in the last 8760 hours.  Assessment and Plan:   Melissa Cummings is 52 yo female SP [place] for symptomatic fibroids.    She is very satisfied with her reduction in symptoms. She is still experiencing menstrual cycles, which are manageable with conservative measures.  She is happy with her reduction in the size of the palpable fibroids.   Today we discussed the generally superfluous nature of further routine cross sectional imaging, unless we needed to trouble shoot any questionable symptoms. We also discussed [place] as only addressing the symptomatic fibroids, and that this procedure is not a reliable means of contraception.  I did suggest to her that if she does not want an unplanned pregnancy, then she should be using a reliable  contraceptive method while sexually active. She understands. .    We again discussed that the normal/light cycle that she experienced is normal, and that we may see even more improvement over time as there is involution of the fibroids.     Plan: - We will tentatively schedule her for another follow up at the 12 month mark, about 6 months from now if she likes.   She knows that she can call us if she has any problems.     Electronically Signed: Gilmer Mor 11/04/2022, 8:19 AM   I spent a total of    25 Minutes in remote  clinical consultation, greater than 50% of which was counseling/coordinating care for symptomatic uterine fibroids, SP embolization with [place].    Visit type: Audio only (telephone). Audio (no video) only due to patient's lack of internet/smartphone capability. Alternative for in-person consultation at [facility], [address] [place], [place], [place]. This visit type was conducted due to national recommendations for restrictions regarding the COVID-19 Pandemic (e.g. social distancing).  This format is felt to be most appropriate for this patient at this time.  All issues noted in this document were discussed and addressed.

## 2023-02-05 DIAGNOSIS — I10 Essential (primary) hypertension: Secondary | ICD-10-CM | POA: Diagnosis not present

## 2023-02-05 DIAGNOSIS — E039 Hypothyroidism, unspecified: Secondary | ICD-10-CM | POA: Diagnosis not present

## 2023-02-05 DIAGNOSIS — Z23 Encounter for immunization: Secondary | ICD-10-CM | POA: Diagnosis not present

## 2023-02-05 DIAGNOSIS — Z0000 Encounter for general adult medical examination without abnormal findings: Secondary | ICD-10-CM | POA: Diagnosis not present

## 2023-02-05 DIAGNOSIS — N951 Menopausal and female climacteric states: Secondary | ICD-10-CM | POA: Diagnosis not present

## 2023-02-05 DIAGNOSIS — E559 Vitamin D deficiency, unspecified: Secondary | ICD-10-CM | POA: Diagnosis not present

## 2024-06-07 ENCOUNTER — Other Ambulatory Visit (HOSPITAL_BASED_OUTPATIENT_CLINIC_OR_DEPARTMENT_OTHER): Payer: Self-pay
